# Patient Record
Sex: Male | Born: 1960 | Race: Black or African American | Hispanic: No | Marital: Single | Smoking: Never smoker
Health system: Southern US, Community
[De-identification: ages and names within clinical notes are randomized; demographics above are authoritative.]

## PROBLEM LIST (undated history)

## (undated) ENCOUNTER — Emergency Department (HOSPITAL_COMMUNITY): Payer: MEDICAID

## (undated) DIAGNOSIS — R569 Unspecified convulsions: Secondary | ICD-10-CM

---

## 2006-08-29 ENCOUNTER — Emergency Department (HOSPITAL_COMMUNITY): Admission: EM | Admit: 2006-08-29 | Discharge: 2006-08-29 | Payer: Self-pay | Admitting: Emergency Medicine

## 2015-01-03 ENCOUNTER — Ambulatory Visit (INDEPENDENT_AMBULATORY_CARE_PROVIDER_SITE_OTHER): Payer: Commercial Managed Care - PPO | Admitting: Emergency Medicine

## 2015-01-03 VITALS — BP 142/84 | HR 69 | Temp 97.7°F | Resp 16 | Ht 66.5 in | Wt 182.0 lb

## 2015-01-03 DIAGNOSIS — M545 Low back pain, unspecified: Secondary | ICD-10-CM

## 2015-01-03 DIAGNOSIS — S335XXA Sprain of ligaments of lumbar spine, initial encounter: Secondary | ICD-10-CM | POA: Diagnosis not present

## 2015-01-03 DIAGNOSIS — M5489 Other dorsalgia: Secondary | ICD-10-CM | POA: Diagnosis not present

## 2015-01-03 MED ORDER — CYCLOBENZAPRINE HCL 10 MG PO TABS: 10.0000 mg | ORAL_TABLET | Freq: Three times a day (TID) | ORAL | Status: DC | PRN

## 2015-01-03 MED ORDER — ACETAMINOPHEN-CODEINE #3 300-30 MG PO TABS: 1.0000 | ORAL_TABLET | ORAL | Status: DC | PRN

## 2015-01-03 MED ORDER — NAPROXEN SODIUM 550 MG PO TABS: 550.0000 mg | ORAL_TABLET | Freq: Two times a day (BID) | ORAL | Status: DC

## 2015-01-03 NOTE — Progress Notes (Signed)
[facility] [address], [place] [id number] [phone]  Date:  01/03/2015   Name:  Joe Bell   DOB:  01/11/1961   MRN:  [id number]  PCP:  No primary care provider on file.    Chief Complaint: Back Pain   History of Present Illness:  Joe Bell is a 54 y.o. very pleasant male patient who presents with the following:  Pain in low back for a week.  Says began after bending over lifting trash bags Non radiating No neuro symptoms or weakness. No history of direct injury No history of prior low back pain. Works as a Artistjanitory advil diminishes pain but not gone completely Worse when bends No improvement with over the counter medications or other home remedies.  Denies other complaint or health concern today.   There are no active problems to display for this patient.   History reviewed. No pertinent past medical history.  History reviewed. No pertinent past surgical history.  History  Substance Use Topics  . Smoking status: Never Smoker   . Smokeless tobacco: Not on file  . Alcohol Use: Not on file    History reviewed. No pertinent family history.  No Known Allergies  Medication list has been reviewed and updated.  No current outpatient prescriptions on file prior to visit.   No current facility-administered medications on file prior to visit.    Review of Systems:  As per HPI, otherwise negative.    Physical Examination: Filed Vitals:   01/03/15 1629  BP: 142/84  Pulse: 69  Temp: 97.7 F (36.5 C)  Resp: 16   Filed Vitals:   01/03/15 1629  Height: 5' 6.5" (1.689 m)  Weight: 182 lb (82.555 kg)   Body mass index is 28.94 kg/(m^2). Ideal Body Weight: Weight in (lb) to have BMI = 25: 156.9  GEN: WDWN, NAD, Non-toxic, A & O x 3 HEENT: Atraumatic, Normocephalic. Neck supple. No masses, No LAD. Ears and Nose: No external deformity. CV: RRR, No M/G/R. No JVD. No thrill. No extra heart sounds. PULM: CTA B, no wheezes, crackles,  rhonchi. No retractions. No resp. distress. No accessory muscle use. ABD: S, NT, ND, +BS. No rebound. No HSM. EXTR: No c/c/e NEURO Normal gait.   Gross motor intact lowers. PSYCH: Normally interactive. Conversant. Not depressed or anxious appearing.  Calm demeanor.  BACK  Tender lumbar paraspinous muscles.  Some spasm   Assessment and Plan: Lumbar strain Anaprox Flexeril Heat Tylenol #3  Signed,  Phillips OdorJeffery Nyana Haren, MD

## 2015-01-03 NOTE — Patient Instructions (Signed)
Lumbo Lumbosacral Strain Lumbosacral strain is a strain of any of the parts that make up your lumbosacral vertebrae. Your lumbosacral vertebrae are the bones that make up the lower third of your backbone. Your lumbosacral vertebrae are held together by muscles and tough, fibrous tissue (ligaments).  CAUSES  A sudden blow to your back can cause lumbosacral strain. Also, anything that causes an excessive stretch of the muscles in the low back can cause this strain. This is typically seen when people exert themselves strenuously, fall, lift heavy objects, bend, or crouch repeatedly. RISK FACTORS  Physically demanding work.  Participation in pushing or pulling sports or sports that require a sudden twist of the back (tennis, golf, baseball).  Weight lifting.  Excessive lower back curvature.  Forward-tilted pelvis.  Weak back or abdominal muscles or both.  Tight hamstrings. SIGNS AND SYMPTOMS  Lumbosacral strain may cause pain in the area of your injury or pain that moves (radiates) down your leg.  DIAGNOSIS Your health care provider can often diagnose lumbosacral strain through a physical exam. In some cases, you may need tests such as X-ray exams.  TREATMENT  Treatment for your lower back injury depends on many factors that your clinician will have to evaluate. However, most treatment will include the use of anti-inflammatory medicines. HOME CARE INSTRUCTIONS   Avoid hard physical activities (tennis, racquetball, waterskiing) if you are not in proper physical condition for it. This may aggravate or create problems.  If you have a back problem, avoid sports requiring sudden body movements. Swimming and walking are generally safer activities.  Maintain good posture.  Maintain a healthy weight.  For acute conditions, you may put ice on the injured area.  Put ice in a plastic bag.  Place a towel between your skin and the bag.  Leave the ice on for 20 minutes, 2-3 times a  day.  When the low back starts healing, stretching and strengthening exercises may be recommended. SEEK MEDICAL CARE IF:  Your back pain is getting worse.  You experience severe back pain not relieved with medicines. SEEK IMMEDIATE MEDICAL CARE IF:   You have numbness, tingling, weakness, or problems with the use of your arms or legs.  There is a change in bowel or bladder control.  You have increasing pain in any area of the body, including your belly (abdomen).  You notice shortness of breath, dizziness, or feel faint.  You feel sick to your stomach (nauseous), are throwing up (vomiting), or become sweaty.  You notice discoloration of your toes or legs, or your feet get very cold. MAKE SURE YOU:   Understand these instructions.  Will watch your condition.  Will get help right away if you are not doing well or get worse. Document Released: 07/01/2005 Document Revised: 09/26/2013 Document Reviewed: 05/10/2013 [facility] Patient Information 2015 [employer] [place]. This information is not intended to replace advice given to you by your health care provider. Make sure you discuss any questions you have with your health care provider.

## 2015-08-14 ENCOUNTER — Emergency Department (HOSPITAL_COMMUNITY): Payer: Commercial Managed Care - PPO

## 2015-08-14 ENCOUNTER — Encounter (HOSPITAL_COMMUNITY): Payer: Self-pay | Admitting: Emergency Medicine

## 2015-08-14 ENCOUNTER — Emergency Department (HOSPITAL_COMMUNITY)
Admission: EM | Admit: 2015-08-14 | Discharge: 2015-08-14 | Disposition: A | Discharge status: Home / Self Care | Payer: Commercial Managed Care - PPO | Attending: Emergency Medicine | Admitting: Emergency Medicine

## 2015-08-14 ENCOUNTER — Ambulatory Visit (INDEPENDENT_AMBULATORY_CARE_PROVIDER_SITE_OTHER): Payer: Commercial Managed Care - PPO | Admitting: Family Medicine

## 2015-08-14 VITALS — BP 102/60 | HR 80 | Temp 97.8°F | Resp 16 | Ht 67.25 in | Wt 186.2 lb

## 2015-08-14 DIAGNOSIS — R1032 Left lower quadrant pain: Secondary | ICD-10-CM | POA: Diagnosis not present

## 2015-08-14 DIAGNOSIS — R1031 Right lower quadrant pain: Secondary | ICD-10-CM | POA: Diagnosis not present

## 2015-08-14 DIAGNOSIS — M542 Cervicalgia: Secondary | ICD-10-CM | POA: Diagnosis not present

## 2015-08-14 DIAGNOSIS — J159 Unspecified bacterial pneumonia: Secondary | ICD-10-CM | POA: Diagnosis not present

## 2015-08-14 DIAGNOSIS — I712 Thoracic aortic aneurysm, without rupture, unspecified: Secondary | ICD-10-CM

## 2015-08-14 DIAGNOSIS — M79602 Pain in left arm: Secondary | ICD-10-CM | POA: Diagnosis not present

## 2015-08-14 DIAGNOSIS — R079 Chest pain, unspecified: Secondary | ICD-10-CM | POA: Diagnosis present

## 2015-08-14 DIAGNOSIS — R0789 Other chest pain: Secondary | ICD-10-CM | POA: Diagnosis not present

## 2015-08-14 DIAGNOSIS — J189 Pneumonia, unspecified organism: Secondary | ICD-10-CM

## 2015-08-14 LAB — COMPREHENSIVE METABOLIC PANEL
ALK PHOS: 68 U/L (ref 38–126)
ALT: 22 U/L (ref 17–63)
ANION GAP: 7 (ref 5–15)
AST: 21 U/L (ref 15–41)
Albumin: 3 g/dL — ABNORMAL LOW (ref 3.5–5.0)
BILIRUBIN TOTAL: 0.6 mg/dL (ref 0.3–1.2)
BUN: 15 mg/dL (ref 6–20)
CALCIUM: 8.2 mg/dL — AB (ref 8.9–10.3)
CO2: 25 mmol/L (ref 22–32)
CREATININE: 1.08 mg/dL (ref 0.61–1.24)
Chloride: 107 mmol/L (ref 101–111)
Glucose, Bld: 106 mg/dL — ABNORMAL HIGH (ref 65–99)
Potassium: 4.2 mmol/L (ref 3.5–5.1)
Sodium: 139 mmol/L (ref 135–145)
TOTAL PROTEIN: 6.7 g/dL (ref 6.5–8.1)

## 2015-08-14 LAB — CBC
HEMATOCRIT: 43.9 % (ref 39.0–52.0)
Hemoglobin: 14.1 g/dL (ref 13.0–17.0)
MCH: 27.2 pg (ref 26.0–34.0)
MCHC: 32.1 g/dL (ref 30.0–36.0)
MCV: 84.7 fL (ref 78.0–100.0)
PLATELETS: 276 10*3/uL (ref 150–400)
RBC: 5.18 MIL/uL (ref 4.22–5.81)
RDW: 12 % (ref 11.5–15.5)
WBC: 7.5 10*3/uL (ref 4.0–10.5)

## 2015-08-14 LAB — I-STAT TROPONIN, ED: Troponin i, poc: 0 ng/mL (ref 0.00–0.08)

## 2015-08-14 LAB — LIPASE, BLOOD: Lipase: 23 U/L (ref 11–51)

## 2015-08-14 MED ORDER — IOHEXOL 350 MG/ML SOLN
100.0000 mL | Freq: Once | INTRAVENOUS | Status: AC | PRN
  Administered 2015-08-14: 100 mL via INTRAVENOUS

## 2015-08-14 MED ORDER — ONDANSETRON HCL 4 MG/2ML IJ SOLN
4.0000 mg | Freq: Once | INTRAMUSCULAR | Status: AC
  Administered 2015-08-14: 4 mg via INTRAVENOUS
  Filled 2015-08-14: qty 2

## 2015-08-14 MED ORDER — LEVOFLOXACIN IN D5W 500 MG/100ML IV SOLN
500.0000 mg | Freq: Once | INTRAVENOUS | Status: AC
  Administered 2015-08-14: 500 mg via INTRAVENOUS
  Filled 2015-08-14: qty 100

## 2015-08-14 MED ORDER — MORPHINE SULFATE (PF) 4 MG/ML IV SOLN
4.0000 mg | Freq: Once | INTRAVENOUS | Status: AC
Start: 2015-08-14 — End: 2015-08-14
  Administered 2015-08-14: 4 mg via INTRAVENOUS
  Filled 2015-08-14: qty 1

## 2015-08-14 MED ORDER — LEVOFLOXACIN 500 MG PO TABS: 500.0000 mg | ORAL_TABLET | Freq: Every day | ORAL | Status: DC

## 2015-08-14 NOTE — ED Provider Notes (Signed)
CSN: [id number]     Arrival date & time 08/14/15  1136 History   First MD Initiated Contact with Patient 08/14/15 1155     Chief Complaint  Patient presents with  . Chest Pain  . Neck Pain     (Consider location/radiation/quality/duration/timing/severity/associated sxs/prior Treatment) HPI  Pt presenting with c/o lower abdominal pain as well as left sided neck pain and intermittent chest pain.  He states symptoms began yesterday.  He states the pain in his left neck and chest are worse with movement and palpation of the areas.  No difficulty breathing, no nausea, diaphoresis or radiation of the pain.  No swelling of arms or legs.  He also has developed last night some lower abdominal pain.  He describes the pain as cramping and similar on both sides of lower abdomen.  No fever, no vomiting, no change in stools.  No hx of DVT/PE, no recent travel.  He has not had any treatment piror to arrival, he was seen by his PMD and advised to come to the ED for further evaluation of his symptoms.  There are no other associated systemic symptoms, there are no other alleviating or modifying factors.   History reviewed. No pertinent past medical history. History reviewed. No pertinent past surgical history. No family history on file. Social History  Substance Use Topics  . Smoking status: Never Smoker   . Smokeless tobacco: None  . Alcohol Use: None    Review of Systems  ROS reviewed and all otherwise negative except for mentioned in HPI    Allergies  Review of patient's allergies indicates no known allergies.  Home Medications   Prior to Admission medications   Medication Sig Start Date End Date Taking? Authorizing Provider  ibuprofen (ADVIL,MOTRIN) 200 MG tablet Take 800 mg by mouth every 6 (six) hours as needed for mild pain.   Yes Historical Provider, MD  levofloxacin (LEVAQUIN) 500 MG tablet Take 1 tablet (500 mg total) by mouth daily. 08/14/15   Jerelyn ScottMartha Linker, MD   BP 114/90 mmHg  Pulse  78  Temp(Src) 98.7 F (37.1 C) (Oral)  Resp 18  SpO2 99%  Vitals reviewed Physical Exam  Physical Examination: General appearance - alert, well appearing, and in no distress Mental status - alert, oriented to person, place, and time Eyes - no conjunctival injection no scleral icterus Mouth - mucous membranes moist, pharynx normal without lesions  Neck- left sided tenderness to palpation over SCM and trapezius distributions, no sig LAD Chest - clear to auscultation, no wheezes, rales or rhonchi, symmetric air entry, ttp over anterior left chest pain no crepitus Heart - normal rate, regular rhythm, normal S1, S2, no murmurs, rubs, clicks or gallops Abdomen - soft, nontender, nondistended, no masses or organomegaly Neurological - alert, oriented, normal speech, moving all extremities, sensation intact Extremities - peripheral pulses normal, no pedal edema, no clubbing or cyanosis Skin - normal coloration and turgor, no rashes  ED Course  Procedures (including critical care time) Labs Review Labs Reviewed  COMPREHENSIVE METABOLIC PANEL - Abnormal; Notable for the following:    Glucose, Bld 106 (*)    Calcium 8.2 (*)    Albumin 3.0 (*)    All other components within normal limits  CBC  LIPASE, BLOOD  I-STAT TROPOININ, ED    Imaging Review No results found. I have personally reviewed and evaluated these images and lab results as part of my medical decision-making.   EKG Interpretation   Date/Time:  Wednesday August 14 2015  11:40:30 EST Ventricular Rate:  71 PR Interval:  145 QRS Duration: 90 QT Interval:  381 QTC Calculation: 414 R Axis:   -23 Text Interpretation:  Sinus rhythm Borderline left axis deviation Abnormal  R-wave progression, early transition Borderline T wave abnormalities No  old tracing to compare Confirmed by [facility]  MD, MARTHA [phone]) on  08/14/2015 1:16:39 PM      MDM   Final diagnoses:  Community acquired pneumonia    Pt presenting with left  sided neck pain and chest wall pain with associated lower abdominal pain.  abd CT obtained and shows no acute findings.  CXR/CT show left sided pleural effusion with pneumonia.  Pt started on levaquin in the ED.  I suspect his left neck findings are muscular in origin as he is tender over SCM and trapezius distribution.  CT does show some possible LAD on left neck- advised f/u CT neck as an outpatient.  All results were d/w patient and family at bedside.  Advised to f/u with [facility] on an outpatient basis.  Discharged with strict return precautions.  Pt agreeable with plan.    Jerelyn Scott, MD 08/16/15 [id number]

## 2015-08-14 NOTE — ED Notes (Signed)
[place]ausa interpretor from [facility] utilized to discharge patient.

## 2015-08-14 NOTE — ED Notes (Signed)
Lab contacted regarding CBC result. Zelda in lab to call this RN back when she has more information regarding results.

## 2015-08-14 NOTE — Progress Notes (Addendum)
Subjective:  This chart was scribed for Joe Staggers, MD by Stann Ore, Medical Scribe. This patient was seen in room 14 and the patient's care was started 10:33 AM.   Patient ID: Joe Bell, male    DOB: [birth year], 54 y.o.   MRN: [id number]  HPI Joe Bell is a 54 y.o. male Pt complains of abd and neck pain that was noticed yesterday. He noticed the neck pain in the morning. He noticed the abd and chest pains later that evening.   Neck Pain He noticed left neck pain starting yesterday without any injury. It's radiating down to his left arm. He's taken 4 ibuprofen yesterday. The pain is into anterior neck without posterior neck pain.   Abd Pain He feels heavy with pain in lower quadrants when he moves.   Chest Pain He also has some persistent pressure chest pains that radiates to his back since yesterday as well. The chest pressure has gotten worse than yesterday, as he was able to go to work yesterday, but not today. He rates yesterday's pain being 3-4, and today's pain around 7-8 (on a scale of 1-10, 1 being lowest, 10 being worst). He denies past surgeries in his chest. He denies sweating, nausea, vomiting. He denies family history of heart problems. He denies constipation, diarrhea, black tarry stools, blood stool. He denies any recent air travel, prolonged car travel. He denies history of DVT, or calf swelling.   Language There is a language barrier; his relative translated for him in the room.  Pt's primary language is Hausa. He is from [place].   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   There are no active problems to display for this patient.  History reviewed. No pertinent past medical history. History reviewed. No pertinent past surgical history. No Known Allergies Prior to Admission medications   Medication Sig Start Date End Date Taking? Authorizing Provider  acetaminophen-codeine (TYLENOL #3) 300-30 MG per tablet Take 1-2 tablets by mouth every 4 (four)  hours as needed. Patient not taking: Reported on 08/14/2015 01/03/15   Carmelina Dane, MD  cyclobenzaprine (FLEXERIL) 10 MG tablet Take 1 tablet (10 mg total) by mouth 3 (three) times daily as needed for muscle spasms. Patient not taking: Reported on 08/14/2015 01/03/15   Carmelina Dane, MD  naproxen sodium (ANAPROX DS) 550 MG tablet Take 1 tablet (550 mg total) by mouth 2 (two) times daily with a meal. Patient not taking: Reported on 08/14/2015 01/03/15 01/03/16  Carmelina Dane, MD   Social History   Social History  . Marital Status: Single    Spouse Name: N/A  . Number of Children: N/A  . Years of Education: N/A   Occupational History  . Not on file.   Social History Main Topics  . Smoking status: Never Smoker   . Smokeless tobacco: Not on file  . Alcohol Use: Not on file  . Drug Use: Not on file  . Sexual Activity: Not on file   Other Topics Concern  . Not on file   Social History Narrative    Review of Systems  Constitutional: Negative for fever and diaphoresis.  Cardiovascular: Positive for chest pain. Negative for leg swelling.  Gastrointestinal: Positive for abdominal pain. Negative for nausea, vomiting, diarrhea, constipation and blood in stool.  Musculoskeletal: Positive for back pain and neck pain.       Objective:   Physical Exam  Constitutional: He is oriented to person, place, and time. He appears well-developed and  well-nourished.  HENT:  Head: Normocephalic and atraumatic.  Eyes: EOM are normal. Pupils are equal, round, and reactive to light.  Neck: No JVD present. Carotid bruit is not present.  No cervical tenderness, Soft tissue swelling with diffuse tenderness in the left clavicle   Cardiovascular: Normal rate, regular rhythm and normal heart sounds.   No murmur heard. Pulmonary/Chest: Effort normal and breath sounds normal. He has no rales.  Abdominal: There is tenderness.  Diffusely tender over L and R lower quadrants  Musculoskeletal: He  exhibits no edema.  Neurological: He is alert and oriented to person, place, and time.  Skin: Skin is warm and dry.  Psychiatric: He has a normal mood and affect.  Vitals reviewed.   Filed Vitals:   08/14/15 1014  BP: 102/60  Pulse: 80  Temp: 97.8 F (36.6 C)  TempSrc: Oral  Resp: 16  Height: 5' 7.25" (1.708 m)  Weight: 186 lb 3.2 oz (84.46 kg)  SpO2: 98%   EKG: sinus rate 77, diffuse flattening of t-waves without previous ekg for comparison.  11:07 AM EMS called for transport. IV placed, 20 gauge , right AC, NS     Assessment & Plan:  Joe Acede Lewey is a 54 y.o. male Chest pressure - Plan: EKG 12-Lead  Left arm pain - Plan: EKG 12-Lead  Right lower quadrant abdominal pain  Abdominal pain, LLQ   initial symptoms of left-sided neck to left arm pain followed by left-sided chest pain and pressure with radiation of pain to the back. Describes component of pleuritic pain with deep breath. Followed by lower abdominal pain since yesterday. Pain has increased in intensity his chest since yesterday, but no associated nausea vomiting diaphoresis or dyspnea. No known history of cardiac disease no known history of DVT/PE or risk factors.  - with his pain increase today, with pleuritic and pressure component will have evaluated further through the emergency room. No previous EKG for comparison, nonspecific T-wave diffusely. EMS called for transport as above, transfer of care with report at 1116. Press photographerCharge [facility]..   Further evaluation of abdominal pain through the emergency room, differential diagnosis  Includes diverticulitis with lower abdominal pain, but also use of ibuprofen yesterday, may have some GI upset. Did not give aspirin in office given the abdominal pain and  Ibuprofen use yesterday.   No orders of the defined types were placed in this encounter.   Patient Instructions  Further evalaution will be needed at the hospital for your chest pain/pressure, left arm  pain, and abdominal pain. If follow up needed after the emergency room - return for recheck here as needed. The area of swelling over your left shoulder may be a lipoma (benign fatty tumor), but this can also be evaluated further in the emergency room.       I personally performed the services described in this documentation, which was scribed in my presence. The recorded information has been reviewed and considered, and addended by me as needed.    By signing my name below, I, Stann Oresung-Kai Tsai, attest that this documentation has been prepared under the direction and in the presence of Joe StaggersJeffrey Shakema Surita, MD. Electronically Signed: Stann Oresung-Kai Tsai, Scribe. 08/14/2015 , 10:33 AM .

## 2015-08-14 NOTE — ED Notes (Signed)
Pt from [facility] via ems for c/o left neck pain that radiates to his chest since last night. Pt alert and oriented, nad.

## 2015-08-14 NOTE — ED Notes (Signed)
Patient transported to CT 

## 2015-08-14 NOTE — ED Notes (Signed)
Pt speaks Hausa

## 2015-08-14 NOTE — ED Notes (Signed)
Patient transported to X-ray 

## 2015-08-14 NOTE — Patient Instructions (Addendum)
Further evalaution will be needed at the hospital for your chest pain/pressure, left arm pain, and abdominal pain. If follow up needed after the emergency room - return for recheck here as needed. The area of swelling over your left shoulder may be a lipoma (benign fatty tumor), but this can also be evaluated further in the emergency room.

## 2015-08-14 NOTE — Discharge Instructions (Signed)
Return to the ED with any concerns including difficulty breathing, chest pain, vomiting and not able to keep down liquids or antibiotics, decreased level of alertness/lethargy, or any other alarming symptoms  The CT scan result shows the following---it is recommended that you followp with your primary care doctor to have a CT scan of your neck performed:  "Mild soft tissue edema within the anterior chest wall and extending into the left neck without focal fluid collection. Some questionable lymph nodes are noted in the left neck although evaluation is incomplete. Followup CT of the neck is recommended although given the contrast load today this would be best performed in the coming Days."

## 2015-08-19 ENCOUNTER — Ambulatory Visit (INDEPENDENT_AMBULATORY_CARE_PROVIDER_SITE_OTHER): Payer: Commercial Managed Care - PPO | Admitting: Family Medicine

## 2015-08-19 VITALS — BP 120/80 | HR 66 | Temp 97.2°F | Resp 16 | Ht 67.0 in | Wt 183.4 lb

## 2015-08-19 DIAGNOSIS — J189 Pneumonia, unspecified organism: Secondary | ICD-10-CM

## 2015-08-19 NOTE — Progress Notes (Signed)
This chart was scribed for Joe Sidle, MD by Stann Ore, medical scribe at [facility].The patient was seen in exam room 12 and the patient's care was started at 9:45 AM.  Patient ID: Joe Bell MRN: [id number], DOB: [birth year], 54 y.o. Date of Encounter: 08/19/2015  Primary Physician: No primary care provider on file.  Chief Complaint:  Chief Complaint  Patient presents with  . Follow-up    chest pain and shoulder pain, x 6 days     HPI:  Joe Bell is a 54 y.o. male who presents to [facility] for follow up on chest pain and shoulder pain. He was seen by Dr. Neva Seat in the office 5 days ago. The pain has improved, and he is feeling a lot better. The swelling in the left supraclavicular area has resolved.  ED visit showed some questionable findings CT that suggested community-acquired pneumonia but these were not definitive  Neck Pain He noticed left neck pain starting yesterday without any injury. It's radiating down to his left arm. He's taken 4 ibuprofen yesterday. The pain is into anterior neck without posterior neck pain.   Abd Pain He feels heavy with pain in lower quadrants when he moves.   Chest Pain He also has some persistent pressure chest pains that radiates to his back since yesterday as well. The chest pressure has gotten worse than yesterday, as he was able to go to work yesterday, but not today. He rates yesterday's pain being 3-4, and today's pain around 7-8 (on a scale of 1-10, 1 being lowest, 10 being worst). He denies past surgeries in his chest. He denies sweating, nausea, vomiting. He denies family history of heart problems. He denies constipation, diarrhea, black tarry stools, blood stool. He denies any recent air travel, prolonged car travel. He denies history of DVT, or calf swelling.   Language Pt's primary language is Hausa. He is from [place].   He works in Production designer, theatre/television/film in [employer].  He's from [place].     History reviewed. No pertinent past medical history.   Home Meds: Prior to Admission medications   Medication Sig Start Date End Date Taking? Authorizing Provider  ibuprofen (ADVIL,MOTRIN) 200 MG tablet Take 800 mg by mouth every 6 (six) hours as needed for mild pain.   Yes Historical Provider, MD  levofloxacin (LEVAQUIN) 500 MG tablet Take 1 tablet (500 mg total) by mouth daily. 08/14/15  Yes Jerelyn Scott, MD    Allergies: No Known Allergies  Social History   Social History  . Marital Status: Single    Spouse Name: N/A  . Number of Children: N/A  . Years of Education: N/A   Occupational History  . Not on file.   Social History Main Topics  . Smoking status: Never Smoker   . Smokeless tobacco: Never Used  . Alcohol Use: No  . Drug Use: No  . Sexual Activity: Not on file   Other Topics Concern  . Not on file   Social History Narrative     Review of Systems: Constitutional: negative for fever, chills, night sweats, weight changes, or fatigue  HEENT: negative for vision changes, hearing loss, congestion, rhinorrhea, ST, epistaxis, or sinus pressure Cardiovascular: negative for chest pain or palpitations Respiratory: negative for hemoptysis, wheezing, shortness of breath, or cough Abdominal: negative for abdominal pain, nausea, vomiting, diarrhea, or constipation Dermatological: negative for rash Neurologic: negative for headache, dizziness, or syncope All other systems reviewed and are otherwise negative with the  exception to those above and in the HPI.  Physical Exam: Blood pressure 120/80, pulse 66, temperature 97.2 F (36.2 C), temperature source Oral, resp. rate 16, height 5\' 7"  (1.702 m), weight 183 lb 6.4 oz (83.19 kg), SpO2 97 %., Body mass index is 28.72 kg/(m^2). General: Well developed, well nourished, in no acute distress. Head: Normocephalic, atraumatic, eyes without discharge, sclera non-icteric, nares are without discharge. Bilateral auditory canals  clear, TM's are without perforation, pearly grey and translucent with reflective cone of light bilaterally. Oral cavity moist, posterior pharynx without exudate, erythema, peritonsillar abscess, or post nasal drip.  Neck: Supple. No thyromegaly. Full ROM. No lymphadenopathy. Lungs: Clear bilaterally to auscultation without wheezes, rales, or rhonchi. Breathing is unlabored. Heart: RRR with S1 S2. No murmurs, rubs, or gallops appreciated. Abdomen: Soft, non-tender, non-distended with normoactive bowel sounds. No hepatomegaly. No rebound/guarding. No obvious abdominal masses. Msk:  Strength and tone normal for age. Extremities/Skin: Warm and dry. No clubbing or cyanosis. No edema. No rashes or suspicious lesions. Neuro: Alert and oriented X 3. Moves all extremities spontaneously. Gait is normal. CNII-XII grossly in tact. Psych:  Responds to questions appropriately with a normal affect.    ASSESSMENT AND PLAN:  54 y.o. year old male with  This chart was scribed in my presence and reviewed by me personally.    ICD-9-CM ICD-10-CM   1. CAP (community acquired pneumonia) 486 J18.9    Exam is normal today so I think the patient just needs to finish his medicines and return as needed.   By signing my name below, I, Stann Oresung-Kai Tsai, attest that this documentation has been prepared under the direction and in the presence of Joe SidleKurt Maybell Misenheimer, MD. Electronically Signed: Stann Oresung-Kai Tsai, Scribe. 08/19/2015 , 9:45 AM .  Signed, Joe SidleKurt Regan Mcbryar, MD 08/19/2015 9:45 AM

## 2015-08-19 NOTE — Patient Instructions (Signed)
Finish her medicines as directed. Expect complete resolution in the next few days

## 2019-11-22 ENCOUNTER — Observation Stay (HOSPITAL_COMMUNITY): Payer: Self-pay

## 2019-11-22 ENCOUNTER — Other Ambulatory Visit: Payer: Self-pay

## 2019-11-22 ENCOUNTER — Inpatient Hospital Stay (HOSPITAL_COMMUNITY)
Admission: EM | Admit: 2019-11-22 | Discharge: 2019-11-26 | DRG: 982 | Disposition: A | Discharge status: Home / Self Care | Payer: Commercial Managed Care - PPO | Attending: Internal Medicine | Admitting: Internal Medicine

## 2019-11-22 ENCOUNTER — Encounter (HOSPITAL_COMMUNITY): Payer: Self-pay

## 2019-11-22 ENCOUNTER — Emergency Department (HOSPITAL_COMMUNITY): Payer: Self-pay

## 2019-11-22 DIAGNOSIS — S43006A Unspecified dislocation of unspecified shoulder joint, initial encounter: Secondary | ICD-10-CM

## 2019-11-22 DIAGNOSIS — S43024A Posterior dislocation of right humerus, initial encounter: Secondary | ICD-10-CM | POA: Diagnosis present

## 2019-11-22 DIAGNOSIS — Z09 Encounter for follow-up examination after completed treatment for conditions other than malignant neoplasm: Secondary | ICD-10-CM

## 2019-11-22 DIAGNOSIS — D72829 Elevated white blood cell count, unspecified: Secondary | ICD-10-CM

## 2019-11-22 DIAGNOSIS — M659 Synovitis and tenosynovitis, unspecified: Secondary | ICD-10-CM | POA: Diagnosis present

## 2019-11-22 DIAGNOSIS — R569 Unspecified convulsions: Secondary | ICD-10-CM

## 2019-11-22 DIAGNOSIS — D72828 Other elevated white blood cell count: Secondary | ICD-10-CM | POA: Diagnosis present

## 2019-11-22 DIAGNOSIS — S42291A Other displaced fracture of upper end of right humerus, initial encounter for closed fracture: Secondary | ICD-10-CM | POA: Diagnosis present

## 2019-11-22 DIAGNOSIS — S42144A Nondisplaced fracture of glenoid cavity of scapula, right shoulder, initial encounter for closed fracture: Secondary | ICD-10-CM | POA: Diagnosis present

## 2019-11-22 DIAGNOSIS — E872 Acidosis: Secondary | ICD-10-CM | POA: Diagnosis present

## 2019-11-22 DIAGNOSIS — Z20822 Contact with and (suspected) exposure to covid-19: Secondary | ICD-10-CM | POA: Diagnosis present

## 2019-11-22 DIAGNOSIS — S4291XA Fracture of right shoulder girdle, part unspecified, initial encounter for closed fracture: Secondary | ICD-10-CM | POA: Diagnosis present

## 2019-11-22 DIAGNOSIS — M25711 Osteophyte, right shoulder: Secondary | ICD-10-CM | POA: Diagnosis present

## 2019-11-22 DIAGNOSIS — G4089 Other seizures: Principal | ICD-10-CM | POA: Diagnosis present

## 2019-11-22 DIAGNOSIS — R739 Hyperglycemia, unspecified: Secondary | ICD-10-CM | POA: Diagnosis present

## 2019-11-22 DIAGNOSIS — X58XXXA Exposure to other specified factors, initial encounter: Secondary | ICD-10-CM | POA: Diagnosis present

## 2019-11-22 LAB — SARS CORONAVIRUS 2 (TAT 6-24 HRS): SARS Coronavirus 2: NEGATIVE

## 2019-11-22 LAB — URINALYSIS, ROUTINE W REFLEX MICROSCOPIC
Bilirubin Urine: NEGATIVE
Glucose, UA: 50 mg/dL — AB
Ketones, ur: NEGATIVE mg/dL
Leukocytes,Ua: NEGATIVE
Nitrite: NEGATIVE
Protein, ur: NEGATIVE mg/dL
Specific Gravity, Urine: 1.019 (ref 1.005–1.030)
pH: 5 (ref 5.0–8.0)

## 2019-11-22 LAB — CBC WITH DIFFERENTIAL/PLATELET
Abs Immature Granulocytes: 0.06 10*3/uL (ref 0.00–0.07)
Basophils Absolute: 0 10*3/uL (ref 0.0–0.1)
Basophils Relative: 0 %
Eosinophils Absolute: 0.1 10*3/uL (ref 0.0–0.5)
Eosinophils Relative: 1 %
HCT: 41 % (ref 39.0–52.0)
Hemoglobin: 12.8 g/dL — ABNORMAL LOW (ref 13.0–17.0)
Immature Granulocytes: 0 %
Lymphocytes Relative: 36 %
Lymphs Abs: 5.6 10*3/uL — ABNORMAL HIGH (ref 0.7–4.0)
MCH: 27.2 pg (ref 26.0–34.0)
MCHC: 31.2 g/dL (ref 30.0–36.0)
MCV: 87.2 fL (ref 80.0–100.0)
Monocytes Absolute: 0.9 10*3/uL (ref 0.1–1.0)
Monocytes Relative: 6 %
Neutro Abs: 8.8 10*3/uL — ABNORMAL HIGH (ref 1.7–7.7)
Neutrophils Relative %: 57 %
Platelets: 281 10*3/uL (ref 150–400)
RBC: 4.7 MIL/uL (ref 4.22–5.81)
RDW: 12 % (ref 11.5–15.5)
WBC: 15.5 10*3/uL — ABNORMAL HIGH (ref 4.0–10.5)
nRBC: 0 % (ref 0.0–0.2)

## 2019-11-22 LAB — RAPID URINE DRUG SCREEN, HOSP PERFORMED
Amphetamines: NOT DETECTED
Barbiturates: NOT DETECTED
Benzodiazepines: POSITIVE — AB
Cocaine: NOT DETECTED
Opiates: NOT DETECTED
Tetrahydrocannabinol: NOT DETECTED

## 2019-11-22 LAB — COMPREHENSIVE METABOLIC PANEL
ALT: 19 U/L (ref 0–44)
AST: 44 U/L — ABNORMAL HIGH (ref 15–41)
Albumin: 3.8 g/dL (ref 3.5–5.0)
Alkaline Phosphatase: 54 U/L (ref 38–126)
Anion gap: 19 — ABNORMAL HIGH (ref 5–15)
BUN: 13 mg/dL (ref 8–23)
CO2: 17 mmol/L — ABNORMAL LOW (ref 22–32)
Calcium: 8.4 mg/dL — ABNORMAL LOW (ref 8.9–10.3)
Chloride: 103 mmol/L (ref 98–111)
Creatinine, Ser: 1.18 mg/dL (ref 0.61–1.24)
GFR calc Af Amer: >60 mL/min (ref >60)
GFR calc non Af Amer: >60 mL/min (ref >60)
Glucose, Bld: 190 mg/dL — ABNORMAL HIGH (ref 70–99)
Potassium: 4.4 mmol/L (ref 3.5–5.1)
Sodium: 139 mmol/L (ref 135–145)
Total Bilirubin: 0.9 mg/dL (ref 0.3–1.2)
Total Protein: 7.8 g/dL (ref 6.5–8.1)

## 2019-11-22 LAB — GLUCOSE, CAPILLARY
Glucose-Capillary: 104 mg/dL — ABNORMAL HIGH (ref 70–99)
Glucose-Capillary: 88 mg/dL (ref 70–99)
Glucose-Capillary: 102 mg/dL — ABNORMAL HIGH (ref 70–99)
Glucose-Capillary: 90 mg/dL (ref 70–99)
Glucose-Capillary: 111 mg/dL — ABNORMAL HIGH (ref 70–99)

## 2019-11-22 LAB — ETHANOL: Alcohol, Ethyl (B): <10 mg/dL (ref <10)

## 2019-11-22 LAB — CBG MONITORING, ED: Glucose-Capillary: 135 mg/dL — ABNORMAL HIGH (ref 70–99)

## 2019-11-22 MED ORDER — LORAZEPAM 2 MG/ML IJ SOLN: 1.0000 mg | Freq: Once | INTRAMUSCULAR | Status: AC

## 2019-11-22 MED ORDER — LORAZEPAM 2 MG/ML IJ SOLN
INTRAMUSCULAR | Status: AC
  Administered 2019-11-22: 02:00:00 1 mg via INTRAVENOUS
  Filled 2019-11-22: qty 1

## 2019-11-22 MED ORDER — ACETAMINOPHEN 650 MG RE SUPP: 650.0000 mg | Freq: Four times a day (QID) | RECTAL | Status: DC | PRN

## 2019-11-22 MED ORDER — LORAZEPAM 2 MG/ML IJ SOLN: 2.0000 mg | Freq: Once | INTRAMUSCULAR | Status: DC

## 2019-11-22 MED ORDER — SODIUM CHLORIDE 0.45 % IV SOLN: INTRAVENOUS | Status: AC

## 2019-11-22 MED ORDER — ONDANSETRON HCL 4 MG PO TABS: 4.0000 mg | ORAL_TABLET | Freq: Four times a day (QID) | ORAL | Status: DC | PRN

## 2019-11-22 MED ORDER — INSULIN ASPART 100 UNIT/ML ~~LOC~~ SOLN
0.0000 [IU] | SUBCUTANEOUS | Status: DC
  Administered 2019-11-23: 1 [IU] via SUBCUTANEOUS
  Administered 2019-11-24: 20:00:00 2 [IU] via SUBCUTANEOUS
  Administered 2019-11-25: 22:00:00 1 [IU] via SUBCUTANEOUS
  Administered 2019-11-25 – 2019-11-26 (×3): 2 [IU] via SUBCUTANEOUS

## 2019-11-22 MED ORDER — SODIUM CHLORIDE 0.9% FLUSH
3.0000 mL | Freq: Two times a day (BID) | INTRAVENOUS | Status: DC
  Administered 2019-11-22 – 2019-11-26 (×7): 3 mL via INTRAVENOUS

## 2019-11-22 MED ORDER — ACETAMINOPHEN 325 MG PO TABS
650.0000 mg | ORAL_TABLET | Freq: Four times a day (QID) | ORAL | Status: DC | PRN
  Administered 2019-11-22 – 2019-11-24 (×2): 650 mg via ORAL
  Filled 2019-11-22 (×2): qty 2

## 2019-11-22 MED ORDER — LEVETIRACETAM 500 MG PO TABS
500.0000 mg | ORAL_TABLET | Freq: Two times a day (BID) | ORAL | Status: DC
  Administered 2019-11-22 – 2019-11-26 (×9): 500 mg via ORAL
  Filled 2019-11-22 (×9): qty 1

## 2019-11-22 MED ORDER — ONDANSETRON HCL 4 MG/2ML IJ SOLN: 4.0000 mg | Freq: Four times a day (QID) | INTRAMUSCULAR | Status: DC | PRN

## 2019-11-22 MED ORDER — HALOPERIDOL LACTATE 5 MG/ML IJ SOLN
5.0000 mg | Freq: Once | INTRAMUSCULAR | Status: AC
  Administered 2019-11-22: 04:00:00 5 mg via INTRAVENOUS
  Filled 2019-11-22: qty 1

## 2019-11-22 MED ORDER — DEXTROSE 10 % IV SOLN: INTRAVENOUS | Status: DC

## 2019-11-22 MED ORDER — LEVETIRACETAM IN NACL 1000 MG/100ML IV SOLN
1000.0000 mg | Freq: Once | INTRAVENOUS | Status: AC
  Administered 2019-11-22: 02:00:00 1000 mg via INTRAVENOUS
  Filled 2019-11-22: qty 100

## 2019-11-22 NOTE — Evaluation (Signed)
Clinical/Bedside Swallow Evaluation Patient Details  Name: Joe Bell MRN: [id number] Date of Birth: October 22, 1960  Today's Date: 11/22/2019 Time: SLP Start Time (ACUTE ONLY): 1105 SLP Stop Time (ACUTE ONLY): 1130 SLP Time Calculation (min) (ACUTE ONLY): 25 min  Past Medical History: History reviewed. No pertinent past medical history. Past Surgical History: History reviewed. No pertinent surgical history. HPI:  Joe Bell is a 59 y.o. male with no known past medical history. Unresponsive at home, negative CT, undergoing assessment for possible seizures.    Assessment / Plan / Recommendation Clinical Impression  Pt is at reduced risk of aspiration as he consumed thin liquids via straw and graham cracker with no overt s/s of aspiration or dysphagia. At this time, recommend upgrading pt's diet to regular with thin liquids. Further skilled ST is not indicated for dysphagia.  SLP Visit Diagnosis: Dysphagia, unspecified (R13.10)    Aspiration Risk  No limitations    Diet Recommendation Regular;Thin liquid   Liquid Administration via: Cup;Straw Medication Administration: Whole meds with liquid Supervision: Patient able to self feed Compensations: Slow rate;Small sips/bites Postural Changes: Seated upright at 90 degrees    Other  Recommendations Oral Care Recommendations: Oral care BID   Follow up Recommendations None      Frequency and Duration            Prognosis        Swallow Study   General Date of Onset: 11/21/19 HPI: [name] is a 59 y.o. male with no known past medical history. Unresponsive at home, negative CT, undergoing assessment for possible seizures.  Type of Study: Bedside Swallow Evaluation Previous Swallow Assessment: none in chart Diet Prior to this Study: NPO Temperature Spikes Noted: No Respiratory Status: Room air History of Recent Intubation: No Behavior/Cognition: Alert;Pleasant mood Oral Cavity Assessment: Within Functional Limits Oral Care  Completed by SLP: No Oral Cavity - Dentition: Adequate natural dentition Vision: Functional for self-feeding Self-Feeding Abilities: Able to feed self Patient Positioning: Upright in bed Baseline Vocal Quality: Normal Volitional Cough: Strong Volitional Swallow: Able to elicit    Oral/Motor/Sensory Function Overall Oral Motor/Sensory Function: Within functional limits   Ice Chips Ice chips: Not tested   Thin Liquid Thin Liquid: Within functional limits Presentation: Self Fed;Straw    Nectar Thick Nectar Thick Liquid: Not tested   Honey Thick Honey Thick Liquid: Not tested   Puree Puree: Not tested   Solid     Solid: Within functional limits Presentation: Self Fed      [name] 11/22/2019,1:18 PM

## 2019-11-22 NOTE — Progress Notes (Signed)
This patient was admitted by my colleague, Dr. Dr. Antionette Char early this morning from the ED after he was found to be poorly responsive with agonal respirations at home. When EMS arrived he became more alert and was brought to the ED where he was found to have generalized tocnic-clonic seizure in the ED.   [facility] were consulted to admit the patient for further evaluation and care.  Neurology was consulted. The patient has been loaded with keppra and started on a regular dose. EEG is pending.  Vital signs are within normal limits except blood pressures are a little elevated and the patient has a low grade temp at 99.5. he is saturating 96% on room air.  The patient is resting comfortably. He is awake and alert. No acute distress. No increased work of breathing.. Pulses appear normal. No cyanosis, clubbing or edema. The patient is moving all extremities.  I appreciate neurology's assistance.

## 2019-11-22 NOTE — ED Triage Notes (Signed)
Pt arrived via [facility] from home. Pt does not understand english, pt speaks Hausa. Pt's family said that they found him on the couch having trouble breathing and that he would not wake up. EMS stated pt was unconscious but breathing when they arrived. Once EMS started doing things pt became alert and slowly started trying to communicate with EMS. Pt denied any hx of seizures but also stated he was not sure what that was.

## 2019-11-22 NOTE — H&P (Signed)
History and Physical    Joe Bell DOB: September 24, 1961 DOA: 11/22/2019  PCP: Patient, No Pcp Per   Patient coming from: Home   Chief Complaint: Poorly responsive, abnormal breathing   HPI: Joe Bell is a 59 y.o. male with no known past medical history, now presenting to the emergency department after he was found to be poorly responsive with abnormal breathing at home.  Patient is accompanied by a friend who lives in the same house, reports that the patient seemed to be in his usual state yesterday evening, is not known to use alcohol or illicit substances, and does not not have any known chronic illness or seizure history.  Patient was reportedly noted to have abnormal respirations while lying on a couch, could not be aroused, and EMS was called.  In the presence of EMS, the patient began to become more alert and was brought into the ED.  ED Course: Upon arrival to the ED, patient is found to be afebrile, saturating mid 90s on room air, and with stable blood pressure.  EKG features a sinus rhythm.  Chemistry panel with mild metabolic acidosis and glucose 190.  CBC notable for leukocytosis to 15,500.  Patient had generalized seizure like activity in the emergency department, was given 1 mg IV Ativan, 1 g IV Keppra, and neurology was consulted by the ED physician.  Neurology has seen the patient in the emergency department and recommends noncontrast head CT, MRI brain with and without contrast if head CT is negative, EEG, and continued Keppra.  Review of Systems:  Unable to complete ROS secondary the patient's clinical condition.  History reviewed. No pertinent past medical history.  History reviewed. No pertinent surgical history.   reports that he has never smoked. He has never used smokeless tobacco. He reports that he does not drink alcohol or use drugs.  No Known Allergies  History reviewed. No pertinent family history.   Prior to Admission medications   Medication Sig  Start Date End Date Taking? Authorizing Provider  ibuprofen (ADVIL,MOTRIN) 200 MG tablet Take 800 mg by mouth every 6 (six) hours as needed for mild pain.    [provider]  levofloxacin (LEVAQUIN) 500 MG tablet Take 1 tablet (500 mg total) by mouth daily. 08/14/15   Pixie Casino, MD    Physical Exam: Vitals:   11/22/19 0018 11/22/19 0020 11/22/19 0305  BP:  130/73 136/74  Pulse:  74 80  Resp:  14 18  Temp:  97.8 F (36.6 C) 97.8 F (36.6 C)  TempSrc:  Oral Axillary  SpO2: 100% 98% 94%     Constitutional: No acute respiratory distress, lethargic   Eyes: PERTLA, lids and conjunctivae normal ENMT: Mucous membranes are moist. Posterior pharynx clear of any exudate or lesions.   Neck: normal, supple, no masses, no thyromegaly Respiratory: clear to auscultation bilaterally, no wheezing, no crackles. No accessory muscle use.  Cardiovascular: S1 & S2 heard, regular rate and rhythm. No extremity edema. Abdomen: No distension, no tenderness, soft. Bowel sounds active.  Musculoskeletal: no clubbing / cyanosis. No joint deformity upper and lower extremities.   Skin: no significant rashes, lesions, ulcers. Warm, dry, well-perfused. Neurologic: No facial asymmetry, PERRL. Sensation intact. Moving all extremities spontaneously. Not following commands or speaking.    Labs and Imaging on Admission: I have personally reviewed following labs and imaging studies  CBC: Recent Labs  Lab 11/22/19 0109  WBC 15.5*  NEUTROABS 8.8*  HGB 12.8*  HCT 41.0  MCV 87.2  PLT 281   Basic Metabolic Panel: Recent Labs  Lab 11/22/19 0109  NA 139  K 4.4  CL 103  CO2 17*  GLUCOSE 190*  BUN 13  CREATININE 1.18  CALCIUM 8.4*   GFR: CrCl cannot be calculated (Unknown ideal weight.). Liver Function Tests: Recent Labs  Lab 11/22/19 0109  AST 44*  ALT 19  ALKPHOS 54  BILITOT 0.9  PROT 7.8  ALBUMIN 3.8   No results for input(s): LIPASE, AMYLASE in the last 168 hours. No results for  input(s): AMMONIA in the last 168 hours. Coagulation Profile: No results for input(s): INR, PROTIME in the last 168 hours. Cardiac Enzymes: No results for input(s): CKTOTAL, CKMB, CKMBINDEX, TROPONINI in the last 168 hours. BNP (last 3 results) No results for input(s): PROBNP in the last 8760 hours. HbA1C: No results for input(s): HGBA1C in the last 72 hours. CBG: Recent Labs  Lab 11/22/19 0056  GLUCAP 135*   Lipid Profile: No results for input(s): CHOL, HDL, LDLCALC, TRIG, CHOLHDL, LDLDIRECT in the last 72 hours. Thyroid Function Tests: No results for input(s): TSH, T4TOTAL, FREET4, T3FREE, THYROIDAB in the last 72 hours. Anemia Panel: No results for input(s): VITAMINB12, FOLATE, FERRITIN, TIBC, IRON, RETICCTPCT in the last 72 hours. Urine analysis: No results found for: COLORURINE, APPEARANCEUR, LABSPEC, PHURINE, GLUCOSEU, HGBUR, BILIRUBINUR, KETONESUR, PROTEINUR, UROBILINOGEN, NITRITE, LEUKOCYTESUR Sepsis Labs: @LABRCNTIP (procalcitonin:4,lacticidven:4) )No results found for this or any previous visit (from the past 240 hour(s)).   Radiological Exams on Admission: No results found.  EKG: Independently reviewed. Sinus rhythm, RSR' in V1.   Assessment/Plan   1. First seizure  - Presents after being found poorly responsive, had generalized seizure in ED  - Per friend at bedside, pt does not have seizure hx, does not use EtOH or illicit substances, and had been in his usual state the evening of 11/21/19  - Patient was treated with Ativan and Keppra 1g IV in ED and neurology was consulted  - Head CT is pending, to be followed by MRI brain with and without if negative  - Continue seizure precautions, EEG, and Keppra 500 mg BID per neurology recommendations    2. Hyperglycemia  - Serum glucose is 190, no documented hx of DM - Check A1c, treat with low-intensity SSI if needed   3. Leukocytosis  - No infectious s/s, likely reactive to #1  - Culture if febrile, repeat CBC in am       DVT prophylaxis: SCD's until head CT, then could start pharmacologic ppx  Code Status: Full  Family Communication: Family friend updated at bedside Disposition Plan: Likely back home once seizures controlled.  Consults called: Neurology Admission status: Observation     11/23/19, MD [facility] Pager: See [email or web address]  If 7AM-7PM, please contact the daytime attending [email or web address]  11/22/2019, 4:33 AM

## 2019-11-22 NOTE — ED Provider Notes (Addendum)
TIME SEEN: 12:25 AM  CHIEF COMPLAINT: Possible seizure  HPI: Patient is a 59 year old male with no significant past medical history who speaks Hausa who presents to the emergency department with [facility] for possible seizure.  His family friend is at bedside who witnessed this episode.  States that he came home from work around 9:30 PM and states he was in his normal state of health.  Friend says that he was sitting in a chair when he became unresponsive with sonorous respirations and had foaming at the mouth.  He did not witness any seizure activity.  There was no tongue biting or incontinence.  Patient seemed confused when he finally came to when EMS arrived.  Patient states he does not remember anything until coming here to the emergency department.  He denies headache, chest pain, abdominal pain, neck or back pain, numbness or focal weakness.  No history of seizures.  He is not on any medications.  No drug or alcohol use.  No recent fevers, cough, vomiting, diarrhea.  He works at [employer].  No [place] interpretor available.  Family friend at bedside interpreting.   ROS: See HPI Constitutional: no fever  Eyes: no drainage  ENT: no runny nose   Cardiovascular:  no chest pain  Resp: no SOB  GI: no vomiting GU: no dysuria Integumentary: no rash  Allergy: no hives  Musculoskeletal: no leg swelling  Neurological: no slurred speech ROS otherwise negative  PAST MEDICAL HISTORY/PAST SURGICAL HISTORY:  History reviewed. No pertinent past medical history.  MEDICATIONS:  Prior to Admission medications   Medication Sig Start Date End Date Taking? Authorizing Provider  ibuprofen (ADVIL,MOTRIN) 200 MG tablet Take 800 mg by mouth every 6 (six) hours as needed for mild pain.    [provider]  levofloxacin (LEVAQUIN) 500 MG tablet Take 1 tablet (500 mg total) by mouth daily. 08/14/15   Mabe, Latanya Maudlin, MD    ALLERGIES:  No Known Allergies  SOCIAL HISTORY:  Social History    Tobacco Use  . Smoking status: Never Smoker  . Smokeless tobacco: Never Used  Substance Use Topics  . Alcohol use: No    Alcohol/week: 0.0 standard drinks    FAMILY HISTORY: History reviewed. No pertinent family history.  EXAM: BP 130/73   Pulse 74   Temp 97.8 F (36.6 C) (Oral)   Resp 14   SpO2 98%  CONSTITUTIONAL: Alert and oriented x 3 and responds appropriately to questions. Well-appearing; well-nourished HEAD: Normocephalic EYES: Conjunctivae clear, pupils appear equal, EOM appear intact ENT: normal nose; moist mucous membranes NECK: Supple, normal ROM CARD: RRR; S1 and S2 appreciated; no murmurs, no clicks, no rubs, no gallops RESP: Normal chest excursion without splinting or tachypnea; breath sounds clear and equal bilaterally; no wheezes, no rhonchi, no rales, no hypoxia or respiratory distress, speaking full sentences ABD/GI: Normal bowel sounds; non-distended; soft, non-tender, no rebound, no guarding, no peritoneal signs, no hepatosplenomegaly BACK:  The back appears normal EXT: Normal ROM in all joints; no deformity noted, no edema; no cyanosis SKIN: Normal color for age and race; warm; no rash on exposed skin NEURO: Moves all extremities equally, no pronator drift, sensation to light touch intact diffusely, cranial nerves II through XII intact, normal speech PSYCH: The patient's mood and manner are appropriate.   MEDICAL DECISION MAKING: Patient here after possible seizure.  After interviewing the patient and examining him, called back into the room by patient's friend as patient began to have a generalized tonic-clonic seizure.  When I walked into the room patient is no longer tense or shaking but friend reports that he was tense and shaking in all 4 extremities.  Currently with sonorous respirations and facial twitching with a right gaze preference.  Given 2 mg of IV Ativan and will start 1 g of IV Keppra.  Will obtain labs, urine, CT of the head.  Blood glucose  normal with EMS and here in the ED.  EKG shows no ischemia, arrhythmia, or interval abnormality.  ED PROGRESS: Patient continues to be postictal and agitated.  He received another milligram of IV Ativan in an attempt to obtain his head CT without any relief.  I feel he will need further sedation to obtain urinalysis, head CT.  Given he has not come back to his baseline and has now been at least 2 hours since his seizure, I feel he will need admission.  Will discuss with neurology to have them on board and to see if they would like to see patient before he is given more sedation.   3:35 AM  Spoke with Dr. Otelia Limes with neurology.  Neurology will see patient in consult.  Agrees with loading with IV Keppra and starting Keppra 500 mg twice daily.  Recommends holding on head CT obtaining MRI with and without contrast once patient more sedate.  Will order IV Haldol.  Agrees with medicine admission.  Will need EEG in the morning.   Neurology evaluated patient and recommends now obtaining head CT but still getting MRI with and without contrast and EEG in the morning.  Friend at bedside has been updated.  Patient still slightly agitated but able to follow commands at this time.  Still hemodynamically stable without further seizure activity.  Will give IV Haldol for sedation prior to obtaining catheterized urine specimen and head CT.   3:54 AM Discussed patient's case with hospitalist, Dr. Antionette Char.  I have recommended admission and patient (and family if present) agree with this plan. Admitting physician will place admission orders.   Hospitalist is okay following up on head CT.  I reviewed all nursing notes, vitals, pertinent previous records and interpreted all EKGs, lab and urine results, imaging (as available).    EKG Interpretation  Date/Time:  Wednesday November 22 2019 01:13:18 EST Ventricular Rate:  90 PR Interval:    QRS Duration: 103 QT Interval:  381 QTC Calculation: 467 R Axis:   -60 Text  Interpretation: Sinus rhythm LAD, consider left anterior fascicular block RSR' in V1 or V2, right VCD or RVH No significant change since last tracing Confirmed by Ward, Baxter Hire [phone]) on 11/22/2019 1:25:00 AM        CRITICAL CARE Performed by: Baxter Hire Ward   Total critical care time: 50 minutes  Critical care time was exclusive of separately billable procedures and treating other patients.  Critical care was necessary to treat or prevent imminent or life-threatening deterioration.  Critical care was time spent personally by me on the following activities: development of treatment plan with patient and/or surrogate as well as nursing, discussions with consultants, evaluation of patient's response to treatment, examination of patient, obtaining history from patient or surrogate, ordering and performing treatments and interventions, ordering and review of laboratory studies, ordering and review of radiographic studies, pulse oximetry and re-evaluation of patient's condition.   Joe Bell was evaluated in Emergency Department on 11/22/2019 for the symptoms described in the history of present illness. He was evaluated in the context of the global COVID-19 pandemic, which necessitated consideration that  the patient might be at risk for infection with the SARS-CoV-2 virus that causes COVID-19. Institutional protocols and algorithms that pertain to the evaluation of patients at risk for COVID-19 are in a state of rapid change based on information released by regulatory bodies including the CDC and federal and state organizations. These policies and algorithms were followed during the patient's care in the ED.  Patient was seen wearing N95, face shield, gloves.    Ward, Delice Bison, DO 11/22/19 0354    Ward, Delice Bison, DO 11/22/19 [phone]

## 2019-11-22 NOTE — Consult Note (Signed)
NEURO HOSPITALIST CONSULT NOTE   Requestig physician: Dr. Elesa Massed  Reason for Consult: New onset seizures  History obtained from:  Friend and Chart     HPI:                                                                                                                                          Joe Bell is an 59 y.o. male originally from [place], with limited [place] language use (speaks Hausa), who presents via EMS after a new onset seizure at home. He was found on the couch by family with trouble breathing, unable to wake up. An somewhat different historical account in the notes documents him sitting in a chair and then becoming unresponsive with sonorous respirations and foaming at the mouth, but no overt seizure activity and no tongue biting or incontinence. EMS noted the patient to be unconscious but breathing on their arrival. He then became alert and slowly started to try to communicate with EMS. Per his friend, he has no history of seizures. He works at a [employer]. He has no recent symptoms of infection. Was not complaining of a headache, neck stiffness or other neurological symptoms prior to being found unresponsive.   He was able to relate to ED staff that he did not remember the spell or anything else until coming to the ED. He at that time denied headache, chest pain, abdominal pain, neck or back pain, numbness or focal weakness, fevers, cough, vomiting, or diarrhea. He also denied any drug or EtOH use. He is not taking any medications.   In the ED, he had a witnessed GTC seizure with shaking of all 4 extremities. After cessation of the gross motor activity, some facial twitching with right gaze preference was noted, in addition to decreased responsiveness and sonorous respirations.   History reviewed. No pertinent past medical history.  History reviewed. No pertinent surgical history.  History reviewed. No pertinent family history.            Social History:   reports that he has never smoked. He has never used smokeless tobacco. He reports that he does not drink alcohol or use drugs.  No Known Allergies  HOME MEDICATIONS:  None   ROS:                                                                                                                                       As per HPI.    Blood pressure 136/74, pulse 80, temperature 97.8 F (36.6 C), temperature source Axillary, resp. rate 18, SpO2 94 %.   General Examination:                                                                                                       Physical Exam  HEENT-  [place]/AT   Lungs- Respirations unlabored Extremities- No edema  Neurological Examination Mental Status: Somnolent. Will briefly awaken to voice and touch and will follow about 3 simple commands before falling asleep again. Limited single word answers in his native language to questions translated by his friend.  Cranial Nerves: II: Briefly fixated on examiner's face. PERRL.   III,IV, VI: No ptosis. Gazes to left and right when briefly awake.   V,VII: Facial movement is symmetric. Unable to formally test facial sensation  VIII: hearing intact to voice IX,X: Unable to assess XI: No gross asymmetry XII: Unable to assess Motor: Moves all 4 extremities with equal strength, speed and amplitude during spontaneous writhing movements in the bed. He appears uncomfortable but denies pain.  Sensory: Unable to formally assess except for movement of limbs touched.  Deep Tendon Reflexes: 1+ brachioradialis and patellae bilaterally Plantars: Mute bilaterally  Cerebellar/Gait: Unable to assess   Lab Results: Basic Metabolic Panel: Recent Labs  Lab 11/22/19 0109  NA 139  K 4.4  CL 103  CO2 17*  GLUCOSE 190*  BUN 13  CREATININE 1.18  CALCIUM 8.4*    CBC: Recent Labs  Lab 11/22/19 0109   WBC 15.5*  NEUTROABS 8.8*  HGB 12.8*  HCT 41.0  MCV 87.2  PLT 281    Cardiac Enzymes: No results for input(s): CKTOTAL, CKMB, CKMBINDEX, TROPONINI in the last 168 hours.  Lipid Panel: No results for input(s): CHOL, TRIG, HDL, CHOLHDL, VLDL, LDLCALC in the last 168 hours.  Imaging: No results found.   Assessment: 59 year old male presenting with new-onset seizures 1. Exam findings most consistent with postictal state. Per Dr. Elesa Massed, he is somewhat improved since her last evaluation.  2. No neck stiffness or fever to suggest a meningitis. He has a leukocytosis which is more likely to be secondary to his seizures rather than an infection given no recent symptoms of  infection.  3. Labs reveal no findings to suggest a provoked seizure. Per his friend, the patient does not drink EtOH.   Recommendations: 1. CT head. If negative, obtain MRI brain with and without contrast.  2. EEG (ordered) 3. Has been loaded with 1000 mg IV Keppra. Continue with 500 mg BID IV or PO.    Electronically signed: Dr. Kerney Elbe 11/22/2019, 3:44 AM

## 2019-11-22 NOTE — Plan of Care (Signed)

## 2019-11-22 NOTE — Progress Notes (Signed)
EEG complete - results pending 

## 2019-11-22 NOTE — Progress Notes (Signed)
NEUROLOGY PROGRESS NOTE   Subjective: Family members in room and able to translate.  Patient's only complaint is that he has severe pain in his right shoulder.  Patient states that he does not know of any seizures in the past.  Exam: Vitals:   11/22/19 0523 11/22/19 0818  BP: (!) 158/80 (!) 157/103  Pulse: 97 93  Resp: 20 20  Temp: 99.4 F (37.4 C) 98.3 F (36.8 C)  SpO2:  98%    ROS General ROS: negative for - chills, fatigue, fever, night sweats, weight gain or weight loss Psychological ROS: negative for - behavioral disorder, hallucinations, memory difficulties, mood swings or suicidal ideation Ophthalmic ROS: negative for - blurry vision, double vision, eye pain or loss of vision ENT ROS: negative for - epistaxis, nasal discharge, oral lesions, sore throat, tinnitus or vertigo Allergy and Immunology ROS: negative for - hives or itchy/watery eyes Hematological and Lymphatic ROS: negative for - bleeding problems, bruising or swollen lymph nodes Endocrine ROS: negative for - galactorrhea, hair pattern changes, polydipsia/polyuria or temperature intolerance Respiratory ROS: negative for - cough, hemoptysis, shortness of breath or wheezing Cardiovascular ROS: negative for - chest pain, dyspnea on exertion, edema or irregular heartbeat Gastrointestinal ROS: negative for - abdominal pain, diarrhea, hematemesis, nausea/vomiting or stool incontinence Genito-Urinary ROS: negative for - dysuria, hematuria, incontinence or urinary frequency/urgency Musculoskeletal ROS: Positive for - joint swelling and pain in right shoulder Neurological ROS: as noted in HPI Dermatological ROS: negative for rash and skin lesion changes    Physical Exam  Constitutional: Appears well-developed and well-nourished.  Eyes: No scleral injection HENT: No OP obstrucion Head: Normocephalic.  Cardiovascular: Normal rate and regular rhythm.  Respiratory: Effort normal, non-labored breathing GI: Soft.  No  distension. There is no tenderness.  Skin: WDI   Neuro:  Mental Status: Alert, oriented to hospital.  Speech fluent without evidence of aphasia.  Able to follow 3 step commands without difficulty. Cranial Nerves: II:  Visual fields grossly normal,  III,IV, VI: ptosis not present, extra-ocular motions intact bilaterally pupils equal, round, reactive to light and accommodation V,VII: smile symmetric, facial light touch sensation normal bilaterally VIII: hearing normal bilaterally  Motor: Right : Upper extremity   see note   left:     Upper extremity   5/5  Lower extremity   5/5     Lower extremity   5/5 -Can barely move right arm secondary to severe shoulder pain.  Shoulder looks like his been dislocated at this point in time.  He has good grip and good distal pulse. Tone and bulk:normal tone throughout; no atrophy noted Sensory: Pinprick and light touch intact throughout, bilaterally Deep Tendon Reflexes: 1+ throughout    Medications:  Scheduled: . insulin aspart  0-9 Units Subcutaneous Q4H  . levETIRAcetam  500 mg Oral BID  . sodium chloride flush  3 mL Intravenous Q12H   Continuous: . sodium chloride 75 mL/hr at 11/22/19 0550   LEX:[id number] **OR** acetaminophen, ondansetron **OR** ondansetron (ZOFRAN) IV  Pertinent Labs/Diagnostics:   CT Head Wo Contrast  Result Date: 11/22/2019 CLINICAL DATA:  Nontraumatic seizure EXAM: CT HEAD WITHOUT CONTRAST TECHNIQUE: Contiguous axial images were obtained from the base of the skull through the vertex without intravenous contrast. COMPARISON:  None. FINDINGS: Brain: No evidence of acute infarction, hemorrhage, hydrocephalus, extra-axial collection or mass lesion/mass effect. Normal cortical finding to explain seizure. Vascular: No hyperdense vessel or unexpected calcification. Skull: Negative Sinuses/Orbits: Negative IMPRESSION: Negative head CT. Electronically Signed   By: Angelica Chessman  Watts M.D.   On: 11/22/2019 04:47     Impression: This 59 year old male with findings consistent of initial postictal state likely secondary to new onset seizure.  At this time patient is awake and alert.  Able to hold full sentences and conversation with family member who translates.  Exam is nonfocal other than the fact patient has what looks like a dislocated right shoulder.  Recommendations: -EEG -MRI without contrast -Continue Keppra 500 mg twice daily -Seizure precautions -I have contacted the primary physician to obtain x-rays of right shoulder to evaluate for anterior dislocated shoulder   Felicie Morn PA-C [facility] [phone]    11/22/2019, 10:15 AM  I have seen the patient and reviewed the above note. Keppra started due to prolonged post-ictal state, but with only a single episode, not sure if longe term AED is indicated unles MRI or EEG shows evidence of need. Will follow.   Ritta Slot, MD [facility] [phone]  If 7pm- 7am, please page neurology on call as listed in AMION.

## 2019-11-23 ENCOUNTER — Observation Stay (HOSPITAL_COMMUNITY): Payer: Self-pay

## 2019-11-23 LAB — CBC WITH DIFFERENTIAL/PLATELET
Abs Immature Granulocytes: 0.03 10*3/uL (ref 0.00–0.07)
Basophils Absolute: 0 10*3/uL (ref 0.0–0.1)
Basophils Relative: 0 %
Eosinophils Absolute: 0 10*3/uL (ref 0.0–0.5)
Eosinophils Relative: 0 %
HCT: 38.1 % — ABNORMAL LOW (ref 39.0–52.0)
Hemoglobin: 12.2 g/dL — ABNORMAL LOW (ref 13.0–17.0)
Immature Granulocytes: 0 %
Lymphocytes Relative: 28 %
Lymphs Abs: 2.5 10*3/uL (ref 0.7–4.0)
MCH: 26.9 pg (ref 26.0–34.0)
MCHC: 32 g/dL (ref 30.0–36.0)
MCV: 83.9 fL (ref 80.0–100.0)
Monocytes Absolute: 0.7 10*3/uL (ref 0.1–1.0)
Monocytes Relative: 8 %
Neutro Abs: 5.9 10*3/uL (ref 1.7–7.7)
Neutrophils Relative %: 64 %
Platelets: 229 10*3/uL (ref 150–400)
RBC: 4.54 MIL/uL (ref 4.22–5.81)
RDW: 12.1 % (ref 11.5–15.5)
WBC: 9.2 10*3/uL (ref 4.0–10.5)
nRBC: 0 % (ref 0.0–0.2)

## 2019-11-23 LAB — COMPREHENSIVE METABOLIC PANEL
ALT: 25 U/L (ref 0–44)
AST: 57 U/L — ABNORMAL HIGH (ref 15–41)
Albumin: 3.7 g/dL (ref 3.5–5.0)
Alkaline Phosphatase: 49 U/L (ref 38–126)
Anion gap: 11 (ref 5–15)
BUN: 11 mg/dL (ref 8–23)
CO2: 25 mmol/L (ref 22–32)
Calcium: 8.3 mg/dL — ABNORMAL LOW (ref 8.9–10.3)
Chloride: 102 mmol/L (ref 98–111)
Creatinine, Ser: 0.98 mg/dL (ref 0.61–1.24)
GFR calc Af Amer: >60 mL/min (ref >60)
GFR calc non Af Amer: >60 mL/min (ref >60)
Glucose, Bld: 119 mg/dL — ABNORMAL HIGH (ref 70–99)
Potassium: 3.7 mmol/L (ref 3.5–5.1)
Sodium: 138 mmol/L (ref 135–145)
Total Bilirubin: 1.2 mg/dL (ref 0.3–1.2)
Total Protein: 7.5 g/dL (ref 6.5–8.1)

## 2019-11-23 LAB — GLUCOSE, CAPILLARY
Glucose-Capillary: 107 mg/dL — ABNORMAL HIGH (ref 70–99)
Glucose-Capillary: 118 mg/dL — ABNORMAL HIGH (ref 70–99)
Glucose-Capillary: 119 mg/dL — ABNORMAL HIGH (ref 70–99)
Glucose-Capillary: 121 mg/dL — ABNORMAL HIGH (ref 70–99)
Glucose-Capillary: 83 mg/dL (ref 70–99)

## 2019-11-23 LAB — HEMOGLOBIN A1C
Hgb A1c MFr Bld: 5 % (ref 4.8–5.6)
Mean Plasma Glucose: 96.8 mg/dL

## 2019-11-23 LAB — HIV ANTIBODY (ROUTINE TESTING W REFLEX): HIV Screen 4th Generation wRfx: NONREACTIVE

## 2019-11-23 MED ORDER — HYDROCODONE-ACETAMINOPHEN 5-325 MG PO TABS
1.0000 | ORAL_TABLET | Freq: Four times a day (QID) | ORAL | Status: DC | PRN
  Administered 2019-11-23: 22:00:00 2 via ORAL
  Administered 2019-11-23: 12:00:00 1 via ORAL
  Filled 2019-11-23: qty 2
  Filled 2019-11-23: qty 1

## 2019-11-23 MED ORDER — TRAMADOL HCL 50 MG PO TABS: 50.0000 mg | ORAL_TABLET | Freq: Four times a day (QID) | ORAL | Status: DC | PRN

## 2019-11-23 MED ORDER — TRAMADOL HCL 50 MG PO TABS
50.0000 mg | ORAL_TABLET | Freq: Once | ORAL | Status: AC
  Administered 2019-11-23: 50 mg via ORAL
  Filled 2019-11-23: qty 1

## 2019-11-23 MED ORDER — LORAZEPAM 2 MG/ML IJ SOLN
1.0000 mg | Freq: Once | INTRAMUSCULAR | Status: AC | PRN
  Administered 2019-11-23: 12:00:00 1 mg via INTRAVENOUS
  Filled 2019-11-23: qty 1

## 2019-11-23 NOTE — Progress Notes (Signed)
PROGRESS NOTE  Joe Bell XBM:[id number] DOB: 31-Aug-1961 DOA: 11/22/2019 PCP: Patient, No Pcp Per  Brief History   This patient was admitted by my colleague, Dr. Dr. Antionette Bell early this morning from the ED after he was found to be poorly responsive with agonal respirations at home. When EMS arrived he became more alert and was brought to the ED where he was found to have generalized tocnic-clonic seizure in the ED.   [facility] were consulted to admit the patient for further evaluation and care.  Neurology was consulted. The patient has been loaded with keppra and started on a regular dose. EEG is pending.  On 11/22/2019 the patient complained of pain in his right shoulder. CT of the right shoulder was performed this morning based upon an irregularity seen on x-ray performed yesterday. Orthopedic surgery was consulted. They plan to take the patient to surgery tomorrow to reduce the fracture.  Consultants  . Orthopedic surgery . Neurology  Procedures  . EEG  Antibiotics   Anti-infectives (From admission, onward)   None    .   Subjective  The patient is seen with the assistance of an interpretor. He is complaining of right shoulder pain.  Objective   Vitals:  Vitals:   11/23/19 1130 11/23/19 1701  BP: (!) 140/92 (!) 139/98  Pulse: 79 91  Resp: 17 16  Temp: 97.9 F (36.6 C) 98.6 F (37 C)  SpO2: 96% 94%   Exam:  Constitutional:  . The patient is awake, alert, and oriented x 3. No acute distress. Respiratory:  . No increased work of breathing. . No wheezes, rales, or rhonchi . No tactile fremitus Cardiovascular:  . Regular rate and rhythm . No murmurs, ectopy, or gallups. . No lateral PMI. No thrills. Abdomen:  . Abdomen is soft, non-tender, non-distended . No hernias, masses, or organomegaly . Normoactive bowel sounds.  Musculoskeletal:  . No cyanosis, clubbing, or edema . Right shoulder is swollen and painful to palpation or movement. Skin:  . No  rashes, lesions, ulcers . palpation of skin: no induration or nodules Neurologic:  . CN 2-12 intact . Sensation all 4 extremities intact Psychiatric:  . Mental status o Mood, affect appropriate o Orientation to person, place, time  . judgment and insight appear intact   I have personally reviewed the following:   Today's Data  . Vitals, CMP, CBC  Other Data  . EEG - negative MRI brain  - negative.  Scheduled Meds: . insulin aspart  0-9 Units Subcutaneous Q4H  . levETIRAcetam  500 mg Oral BID  . sodium chloride flush  3 mL Intravenous Q12H   Continuous Infusions: . dextrose 50 mL/hr at 11/22/19 1147    Principal Problem:   First time seizure (HCC) Active Problems:   Hyperglycemia   Leukocytosis   LOS: 0 days   A & P  Seizures: Isolated single seizure. No epilleptiform activity on EEG. No abnormality on MRI. Dr. Amada Bell does not feel that the patient required further work up or antiepileptic medication.  Fracture of the Right Shoulder: Orthopedic surgery will take the patient to surgery in the morning to reduce this. I appreciate their assistance. I will continue the patient on keppra in the perioperative period.  Hyperglycemia: Reactive hyperglycemia  Leukocytosis: Reactive due to seizure and fracture of right shoulder.  I have seen and examined this patient myself. I have spent 46 minutes in his evaluation and care.  DVT prophylaxis: SCD's until head CT, then could start pharmacologic ppx  Code Status: Full  Family Communication: None Disposition Plan: Home following surgery when cleared for discharge by surgery.   Joe Oscar, DO [facility] Direct contact: see [email or web address]  7PM-7AM contact night coverage as above 11/23/2019, 5:36 PM  LOS: 0 days

## 2019-11-23 NOTE — Progress Notes (Signed)
Orthopedic Tech Progress Note Patient Details:  Deyton Ellenbecker 1961/08/24 [id number] Applied a SHOULDER IMMOBILIZER per ORTHOPEDIC PA M. JEFFERY [employer] Type of Ortho Device: Shoulder immobilizer Ortho Device/Splint Location: RUE Ortho Device/Splint Interventions: Ordered, Application   Post Interventions Patient Tolerated: Well Instructions Provided: Care of device, Adjustment of device   Donald Pore 11/23/2019, 4:46 PM

## 2019-11-23 NOTE — H&P (View-Only) (Signed)
Reason for Consult:Right shoulder fx Referring Physician: A Swayze  Joe Bell is an 59 y.o. male.  HPI: Joe Bell was admitted yesterday with his first tonic-clonic seizure. Workup is still pending as to cause. Though he was denying pain yesterday when he arrived he began to c/o right shoulder pain later in the day. X-rays showed a possible fx and CT scan confirmed and orthopedic surgery was consulted. Pt does not speak English and visit was conducted through personal interpreter.  History reviewed. No pertinent past medical history.  History reviewed. No pertinent surgical history.  History reviewed. No pertinent family history.  Social History:  reports that he has never smoked. He has never used smokeless tobacco. He reports that he does not drink alcohol or use drugs.  Allergies: No Known Allergies  Medications: I have reviewed the patient's current medications.  Results for orders placed or performed during the hospital encounter of 11/22/19 (from the past 48 hour(s))  CBG monitoring, ED     Status: Abnormal   Collection Time: 11/22/19 12:56 AM  Result Value Ref Range   Glucose-Capillary 135 (H) 70 - 99 mg/dL  CBC with Differential     Status: Abnormal   Collection Time: 11/22/19  1:09 AM  Result Value Ref Range   WBC 15.5 (H) 4.0 - 10.5 K/uL   RBC 4.70 4.22 - 5.81 MIL/uL   Hemoglobin 12.8 (L) 13.0 - 17.0 g/dL   HCT 86.7 67.2 - 09.4 %   MCV 87.2 80.0 - 100.0 fL   MCH 27.2 26.0 - 34.0 pg   MCHC 31.2 30.0 - 36.0 g/dL   RDW 70.9 62.8 - 36.6 %   Platelets 281 150 - 400 K/uL   nRBC 0.0 0.0 - 0.2 %   Neutrophils Relative % 57 %   Neutro Abs 8.8 (H) 1.7 - 7.7 K/uL   Lymphocytes Relative 36 %   Lymphs Abs 5.6 (H) 0.7 - 4.0 K/uL   Monocytes Relative 6 %   Monocytes Absolute 0.9 0.1 - 1.0 K/uL   Eosinophils Relative 1 %   Eosinophils Absolute 0.1 0.0 - 0.5 K/uL   Basophils Relative 0 %   Basophils Absolute 0.0 0.0 - 0.1 K/uL   Immature Granulocytes 0 %   Abs Immature Granulocytes  0.06 0.00 - 0.07 K/uL    Comment: Performed at [facility], [address] [place], [place] [address]  Comprehensive metabolic panel     Status: Abnormal   Collection Time: 11/22/19  1:09 AM  Result Value Ref Range   Sodium 139 135 - 145 mmol/L   Potassium 4.4 3.5 - 5.1 mmol/L   Chloride 103 98 - 111 mmol/L   CO2 17 (L) 22 - 32 mmol/L   Glucose, Bld 190 (H) 70 - 99 mg/dL   BUN 13 8 - 23 mg/dL   Creatinine, Ser 5.46 0.61 - 1.24 mg/dL   Calcium 8.4 (L) 8.9 - 10.3 mg/dL   Total Protein 7.8 6.5 - 8.1 g/dL   Albumin 3.8 3.5 - 5.0 g/dL   AST 44 (H) 15 - 41 U/L   ALT 19 0 - 44 U/L   Alkaline Phosphatase 54 38 - 126 U/L   Total Bilirubin 0.9 0.3 - 1.2 mg/dL   GFR calc non Af Amer >60 >60 mL/min   GFR calc Af Amer >60 >60 mL/min   Anion gap 19 (H) 5 - 15    Comment: Performed at [facility], [address] [place], [place] [address]  Ethanol     Status: None   Collection Time: 11/22/19  1:09 AM  Result Value Ref Range   Alcohol, Ethyl (B) <10 <10 mg/dL    Comment: (NOTE) Lowest detectable limit for serum alcohol is 10 mg/dL. For medical purposes only. Performed at [facility], [address] [place], [place] [address]   SARS CORONAVIRUS 2 (TAT 6-24 HRS) Nasopharyngeal Nasopharyngeal Swab     Status: None   Collection Time: 11/22/19  2:52 AM   Specimen: Nasopharyngeal Swab  Result Value Ref Range   SARS Coronavirus 2 NEGATIVE NEGATIVE    Comment: (NOTE) SARS-CoV-2 target nucleic acids are NOT DETECTED. The SARS-CoV-2 RNA is generally detectable in upper and lower respiratory specimens during the acute phase of infection. Negative results do not preclude SARS-CoV-2 infection, do not rule out co-infections with other pathogens, and should not be used as the sole basis for treatment or other patient management decisions. Negative results must be combined with clinical observations, patient history, and epidemiological information. The expected result is  Negative. Fact Sheet for Patients: HairSlick.no Fact Sheet for Healthcare Providers: quierodirigir.com This test is not yet approved or cleared by the [place] FDA and  has been authorized for detection and/or diagnosis of SARS-CoV-2 by FDA under an Emergency Use Authorization (EUA). This EUA will remain  in effect (meaning this test can be used) for the duration of the COVID-19 declaration under Section 56 4(b)(1) of the Act, 21 U.S.C. section 360bbb-3(b)(1), unless the authorization is terminated or revoked sooner. Performed at [facility], [address], [place], [place] [address]   Urinalysis, Routine w reflex microscopic     Status: Abnormal   Collection Time: 11/22/19  4:21 AM  Result Value Ref Range   Color, Urine YELLOW YELLOW   APPearance HAZY (A) CLEAR   Specific Gravity, Urine 1.019 1.005 - 1.030   pH 5.0 5.0 - 8.0   Glucose, UA 50 (A) NEGATIVE mg/dL   Hgb urine dipstick SMALL (A) NEGATIVE   Bilirubin Urine NEGATIVE NEGATIVE   Ketones, ur NEGATIVE NEGATIVE mg/dL   Protein, ur NEGATIVE NEGATIVE mg/dL   Nitrite NEGATIVE NEGATIVE   Leukocytes,Ua NEGATIVE NEGATIVE   RBC / HPF 0-5 0 - 5 RBC/hpf   WBC, UA 0-5 0 - 5 WBC/hpf   Bacteria, UA RARE (A) NONE SEEN   Squamous Epithelial / LPF 0-5 0 - 5   Mucus PRESENT    Hyaline Casts, UA PRESENT    Sperm, UA PRESENT     Comment: Performed at [facility], [address], [place], [place] [address]  Rapid urine drug screen (hospital performed)     Status: Abnormal   Collection Time: 11/22/19  4:21 AM  Result Value Ref Range   Opiates NONE DETECTED NONE DETECTED   Cocaine NONE DETECTED NONE DETECTED   Benzodiazepines POSITIVE (A) NONE DETECTED   Amphetamines NONE DETECTED NONE DETECTED   Tetrahydrocannabinol NONE DETECTED NONE DETECTED   Barbiturates NONE DETECTED NONE DETECTED    Comment: (NOTE) DRUG SCREEN FOR MEDICAL PURPOSES ONLY.  IF CONFIRMATION  IS NEEDED FOR ANY PURPOSE, NOTIFY LAB WITHIN 5 DAYS. LOWEST DETECTABLE LIMITS FOR URINE DRUG SCREEN Drug Class                     Cutoff (ng/mL) Amphetamine and metabolites    1000 Barbiturate and metabolites    200 Benzodiazepine  200 Tricyclics and metabolites     300 Opiates and metabolites        300 Cocaine and metabolites        300 THC                            50 Performed at [facility], [address], [place], [place] [address]   Glucose, capillary     Status: Abnormal   Collection Time: 11/22/19  5:32 AM  Result Value Ref Range   Glucose-Capillary 104 (H) 70 - 99 mg/dL  Glucose, capillary     Status: None   Collection Time: 11/22/19  9:59 AM  Result Value Ref Range   Glucose-Capillary 88 70 - 99 mg/dL  Glucose, capillary     Status: Abnormal   Collection Time: 11/22/19 11:26 AM  Result Value Ref Range   Glucose-Capillary 102 (H) 70 - 99 mg/dL  Glucose, capillary     Status: None   Collection Time: 11/22/19  3:38 PM  Result Value Ref Range   Glucose-Capillary 90 70 - 99 mg/dL  Glucose, capillary     Status: Abnormal   Collection Time: 11/22/19  8:15 PM  Result Value Ref Range   Glucose-Capillary 111 (H) 70 - 99 mg/dL  Glucose, capillary     Status: Abnormal   Collection Time: 11/23/19 12:38 AM  Result Value Ref Range   Glucose-Capillary 121 (H) 70 - 99 mg/dL  HIV Antibody (routine testing w rflx)     Status: None   Collection Time: 11/23/19  2:13 AM  Result Value Ref Range   HIV Screen 4th Generation wRfx NON REACTIVE NON REACTIVE    Comment: Performed at [facility], [address] [place], [place] [address]  Hemoglobin A1c     Status: None   Collection Time: 11/23/19  2:13 AM  Result Value Ref Range   Hgb A1c MFr Bld 5.0 4.8 - 5.6 %    Comment: (NOTE) Pre diabetes:          5.7%-6.4% Diabetes:              >6.4% Glycemic control for   <7.0% adults with diabetes    Mean Plasma Glucose 96.8 mg/dL    Comment: Performed  at [facility], [address] [place], [place] [address]  CBC WITH DIFFERENTIAL     Status: Abnormal   Collection Time: 11/23/19  2:13 AM  Result Value Ref Range   WBC 9.2 4.0 - 10.5 K/uL   RBC 4.54 4.22 - 5.81 MIL/uL   Hemoglobin 12.2 (L) 13.0 - 17.0 g/dL   HCT 91.4 (L) 78.2 - 95.6 %   MCV 83.9 80.0 - 100.0 fL   MCH 26.9 26.0 - 34.0 pg   MCHC 32.0 30.0 - 36.0 g/dL   RDW 21.3 08.6 - 57.8 %   Platelets 229 150 - 400 K/uL   nRBC 0.0 0.0 - 0.2 %   Neutrophils Relative % 64 %   Neutro Abs 5.9 1.7 - 7.7 K/uL   Lymphocytes Relative 28 %   Lymphs Abs 2.5 0.7 - 4.0 K/uL   Monocytes Relative 8 %   Monocytes Absolute 0.7 0.1 - 1.0 K/uL   Eosinophils Relative 0 %   Eosinophils Absolute 0.0 0.0 - 0.5 K/uL   Basophils Relative 0 %   Basophils Absolute 0.0 0.0 - 0.1 K/uL   Immature Granulocytes 0 %   Abs  Immature Granulocytes 0.03 0.00 - 0.07 K/uL    Comment: Performed at [facility], [place] [address], [place], [place] [address]  Comprehensive metabolic panel     Status: Abnormal   Collection Time: 11/23/19  2:13 AM  Result Value Ref Range   Sodium 138 135 - 145 mmol/L   Potassium 3.7 3.5 - 5.1 mmol/L   Chloride 102 98 - 111 mmol/L   CO2 25 22 - 32 mmol/L   Glucose, Bld 119 (H) 70 - 99 mg/dL   BUN 11 8 - 23 mg/dL   Creatinine, Ser 0.98 0.61 - 1.24 mg/dL   Calcium 8.3 (L) 8.9 - 10.3 mg/dL   Total Protein 7.5 6.5 - 8.1 g/dL   Albumin 3.7 3.5 - 5.0 g/dL   AST 57 (H) 15 - 41 U/L   ALT 25 0 - 44 U/L   Alkaline Phosphatase 49 38 - 126 U/L   Total Bilirubin 1.2 0.3 - 1.2 mg/dL   GFR calc non Af Amer >60 >60 mL/min   GFR calc Af Amer >60 >60 mL/min   Anion gap 11 5 - 15    Comment: Performed at [facility] [address], [place], [place] [address]  Glucose, capillary     Status: Abnormal   Collection Time: 11/23/19  3:49 AM  Result Value Ref Range   Glucose-Capillary 118 (H) 70 - 99 mg/dL  Glucose, capillary     Status: Abnormal   Collection Time: 11/23/19  8:28  AM  Result Value Ref Range   Glucose-Capillary 107 (H) 70 - 99 mg/dL   Comment 1 Notify RN    Comment 2 Document in Chart     EEG  Result Date: 11/23/2019 Lora Havens, MD     11/23/2019  2:03 PM Patient Name: Lian Tanori MRN: [id number] Epilepsy Attending: Lora Havens Referring Physician/Provider: Dr. Mitzi Hansen Date: 11/22/2019 Duration: 26.02 minutes Patient history: 59 year old male presented with new onset seizure.  EEG to evaluate for epilepsy. Level of alertness: Awake, sleep AEDs during EEG study: Keppra, Ativan Technical aspects: This EEG study was done with scalp electrodes positioned according to the 10-20 International system of electrode placement. Electrical activity was acquired at a sampling rate of 500Hz  and reviewed with a high frequency filter of 70Hz  and a low frequency filter of 1Hz . EEG data were recorded continuously and digitally stored. Description: During awake state, no clear posterior dominant was seen.  Sleep was characterized by sleep spindles (12 to 14 Hz), maximal frontocentral as well as slow-wave sleep.  EEG also showed continuous generalized 3-5 theta-delta slowing admixed with an excessive amount of 15 to 18 Hz, 2-3 uV beta activity with irregular morphology distributed symmetrically and diffusely.  Hyperventilation and photic stimulation were not performed. Abnormality -Continuous slow, generalized -Excessive beta, generalized IMPRESSION: This study is suggestive of mild to moderate diffuse encephalopathy, nonspecific to etiology. The excessive beta activity seen in the background is most likely due to the effect of benzodiazepine and is a benign EEG pattern. No seizures or epileptiform discharges were seen throughout the recording. Lora Havens   DG Shoulder Right  Result Date: 11/22/2019 CLINICAL DATA:  Shoulder pain EXAM: RIGHT SHOULDER - 2+ VIEW COMPARISON:  None. FINDINGS: AC joint appears intact. No humeral head dislocation. Osseous deformity along  the inferior aspect of the humerus IMPRESSION: Osseous deformity along the inferior aspect of the right humeral head and neck, may relate to fracture deformity of uncertain age. CT could be  obtained for further evaluation. Electronically Signed   By: Kim  Fujinaga M.D.   On: 11/22/2019 17:39   DG Abd 1 View  Result Date: 11/22/2019 CLINICAL DATA:  Unresponsive EXAM: ABDOMEN - 1 VIEW COMPARISON:  None. FINDINGS: Non inclusion of the left upper quadrant. Nonobstructed gas pattern with moderate stool in the colon. No radiopaque foreign body. Probable phleboliths in the left pelvis. IMPRESSION: Negative, allowing for incomplete inclusion of the left upper quadrant of the abdomen Electronically Signed   By: Kim  Fujinaga M.D.   On: 11/22/2019 17:41   CT Head Wo Contrast  Result Date: 11/22/2019 CLINICAL DATA:  Nontraumatic seizure EXAM: CT HEAD WITHOUT CONTRAST TECHNIQUE: Contiguous axial images were obtained from the base of the skull through the vertex without intravenous contrast. COMPARISON:  None. FINDINGS: Brain: No evidence of acute infarction, hemorrhage, hydrocephalus, extra-axial collection or mass lesion/mass effect. Normal cortical finding to explain seizure. Vascular: No hyperdense vessel or unexpected calcification. Skull: Negative Sinuses/Orbits: Negative IMPRESSION: Negative head CT. Electronically Signed   By: Jonathon  Watts M.D.   On: 11/22/2019 04:47   MR BRAIN WO CONTRAST  Result Date: 11/23/2019 CLINICAL DATA:  Altered mental status. Seizures. EXAM: MRI HEAD WITHOUT CONTRAST TECHNIQUE: Multiplanar, multiecho pulse sequences of the brain and surrounding structures were obtained without intravenous contrast. COMPARISON:  None. FINDINGS: Images are severely degraded by motion. Brain: No acute infarction, hemorrhage, hydrocephalus, extra-axial collection or large mass lesion. Vascular: Normal flow voids. Skull and upper cervical spine: Normal marrow signal. Sinuses/Orbits: Negative. Other:  None. IMPRESSION: 1. Severely motion degraded study. 2. No acute intracranial abnormality identified. Electronically Signed   By: Katyucia  De Macedo Rodrigues M.D.   On: 11/23/2019 13:01   CT SHOULDER RIGHT WO CONTRAST  Result Date: 11/23/2019 CLINICAL DATA:  Severe shoulder pain following seizure EXAM: CT OF THE UPPER RIGHT EXTREMITY WITHOUT CONTRAST TECHNIQUE: Multidetector CT imaging of the upper right extremity was performed according to the standard protocol. COMPARISON:  X-ray 11/22/2019 FINDINGS: Bones/Joint/Cartilage Posterior dislocation of the humeral head relative to the glenoid. The anteromedial aspect of the humeral head remains perched upon the posterior glenoid rim with a large reverse Hill-Sachs impaction fracture. Large comminuted fracture fragment is displaced anteroinferiorly into the axillary pouch measuring 3.2 x 1.6 x 2.7 cm. Probable nondisplaced impaction fracture of the posterior glenoid rim (series 3, image 42). Large complex glenohumeral joint effusion with a small amount of fat density in the non dependent portion compatible with a lipohemarthrosis. AC joint is intact with mild degenerative changes. Ligaments Suboptimally assessed by CT. Muscles and Tendons Rotator cuff tendons are poorly evaluated although a injury to the anterior aspect of the supraspinatus tendon is suspected (series 9, image 55). Preserved rotator cuff muscle bulk. Soft tissues Extensive induration within the adjacent soft tissues without a well-defined fluid collection or hematoma. No axillary lymphadenopathy. Visualized portion of the right lung is clear. IMPRESSION: 1. Posterior dislocation of the humeral head relative to the glenoid with a large reverse Hill-Sachs impaction fracture. Large comminuted fracture fragment is displaced anteroinferiorly into the axillary pouch measuring 3.2 x 1.6 x 2.7 cm. 2. Probable nondisplaced impaction fracture of the posterior glenoid rim. 3. Rotator cuff tendons are poorly  evaluated although an injury to the anterior aspect of the distal supraspinatus tendon is suspected. 4. Large glenohumeral joint lipohemarthrosis. Electronically Signed   By: Nicholas  Plundo D.O.   On: 11/23/2019 10:33   DG CHEST PORT 1 VIEW  Result Date: 11/22/2019 CLINICAL DATA:  Unresponsive EXAM: PORTABLE   CHEST 1 VIEW COMPARISON:  08/14/2015 FINDINGS: No focal airspace disease or effusion. Stable cardiomediastinal silhouette with aortic atherosclerosis. No pneumothorax. No radiopaque foreign body visualized. IMPRESSION: No active disease. Electronically Signed   By: Kim  Fujinaga M.D.   On: 11/22/2019 17:40    Review of Systems  HENT: Negative for ear discharge, ear pain, hearing loss and tinnitus.   Eyes: Negative for photophobia and pain.  Respiratory: Negative for cough and shortness of breath.   Cardiovascular: Negative for chest pain.  Gastrointestinal: Negative for abdominal pain, nausea and vomiting.  Genitourinary: Negative for dysuria, flank pain, frequency and urgency.  Musculoskeletal: Positive for arthralgias (Right shoulder). Negative for back pain, myalgias and neck pain.  Neurological: Negative for dizziness and headaches.  Hematological: Does not bruise/bleed easily.  Psychiatric/Behavioral: The patient is not nervous/anxious.    Blood pressure (!) 140/92, pulse 79, temperature 97.9 F (36.6 C), temperature source Oral, resp. rate 17, height 5' 8" (1.727 m), weight 87 kg, SpO2 96 %. Physical Exam  Constitutional: He appears well-developed and well-nourished. No distress.  HENT:  Head: Normocephalic and atraumatic.  Eyes: Conjunctivae are normal. Right eye exhibits no discharge. Left eye exhibits no discharge. No scleral icterus.  Cardiovascular: Normal rate and regular rhythm.  Respiratory: Effort normal. No respiratory distress.  Musculoskeletal:     Cervical back: Normal range of motion.     Comments: Right shoulder, elbow, wrist, digits- no skin wounds, mod  diffuse TTP, no instability, unwilling or unable to allow external rotation of shoulder  Sens  Ax/R/M/U intact  Mot   Ax/ R/ PIN/ M/ AIN/ U intact  Rad 2+  Neurological: He is alert.  Skin: Skin is warm and dry. He is not diaphoretic.  Psychiatric: He has a normal mood and affect. His behavior is normal.    Assessment/Plan: Right humeral head fx -- Plan hemiarthroplasty vs reverse tomorrow afternoon with Dr. Varkey. Please keep NPO after MN. I spoke with attending and neurology who both have cleared for surgery. Seizure disorder -- per primary service.    Zyona Pettaway J. Elani Delph, PA-C Orthopedic Surgery [phone] 11/23/2019, 3:04 PM  

## 2019-11-23 NOTE — Progress Notes (Signed)
MRI attempted on pt. Pt moving too much with no ability to redirect to obtain diagnostic images.

## 2019-11-23 NOTE — Consult Note (Signed)
Reason for Consult:Right shoulder fx Referring Physician: A Swayze  Joe Bell is an 59 y.o. male.  HPI: [name] was admitted yesterday with his first tonic-clonic seizure. Workup is still pending as to cause. Though he was denying pain yesterday when he arrived he began to c/o right shoulder pain later in the day. X-rays showed a possible fx and CT scan confirmed and orthopedic surgery was consulted. Pt does not speak English and visit was conducted through personal interpreter.  History reviewed. No pertinent past medical history.  History reviewed. No pertinent surgical history.  History reviewed. No pertinent family history.  Social History:  reports that he has never smoked. He has never used smokeless tobacco. He reports that he does not drink alcohol or use drugs.  Allergies: No Known Allergies  Medications: I have reviewed the patient's current medications.  Results for orders placed or performed during the hospital encounter of 11/22/19 (from the past 48 hour(s))  CBG monitoring, ED     Status: Abnormal   Collection Time: 11/22/19 12:56 AM  Result Value Ref Range   Glucose-Capillary 135 (H) 70 - 99 mg/dL  CBC with Differential     Status: Abnormal   Collection Time: 11/22/19  1:09 AM  Result Value Ref Range   WBC 15.5 (H) 4.0 - 10.5 K/uL   RBC 4.70 4.22 - 5.81 MIL/uL   Hemoglobin 12.8 (L) 13.0 - 17.0 g/dL   HCT 86.7 67.2 - 09.4 %   MCV 87.2 80.0 - 100.0 fL   MCH 27.2 26.0 - 34.0 pg   MCHC 31.2 30.0 - 36.0 g/dL   RDW 70.9 62.8 - 36.6 %   Platelets 281 150 - 400 K/uL   nRBC 0.0 0.0 - 0.2 %   Neutrophils Relative % 57 %   Neutro Abs 8.8 (H) 1.7 - 7.7 K/uL   Lymphocytes Relative 36 %   Lymphs Abs 5.6 (H) 0.7 - 4.0 K/uL   Monocytes Relative 6 %   Monocytes Absolute 0.9 0.1 - 1.0 K/uL   Eosinophils Relative 1 %   Eosinophils Absolute 0.1 0.0 - 0.5 K/uL   Basophils Relative 0 %   Basophils Absolute 0.0 0.0 - 0.1 K/uL   Immature Granulocytes 0 %   Abs Immature Granulocytes  0.06 0.00 - 0.07 K/uL    Comment: Performed at [facility], [address] [place], [place] [address]  Comprehensive metabolic panel     Status: Abnormal   Collection Time: 11/22/19  1:09 AM  Result Value Ref Range   Sodium 139 135 - 145 mmol/L   Potassium 4.4 3.5 - 5.1 mmol/L   Chloride 103 98 - 111 mmol/L   CO2 17 (L) 22 - 32 mmol/L   Glucose, Bld 190 (H) 70 - 99 mg/dL   BUN 13 8 - 23 mg/dL   Creatinine, Ser 5.46 0.61 - 1.24 mg/dL   Calcium 8.4 (L) 8.9 - 10.3 mg/dL   Total Protein 7.8 6.5 - 8.1 g/dL   Albumin 3.8 3.5 - 5.0 g/dL   AST 44 (H) 15 - 41 U/L   ALT 19 0 - 44 U/L   Alkaline Phosphatase 54 38 - 126 U/L   Total Bilirubin 0.9 0.3 - 1.2 mg/dL   GFR calc non Af Amer >60 >60 mL/min   GFR calc Af Amer >60 >60 mL/min   Anion gap 19 (H) 5 - 15    Comment: Performed at [facility], [address] [place], [place] [address]  Ethanol     Status: None   Collection Time: 11/22/19  1:09 AM  Result Value Ref Range   Alcohol, Ethyl (B) <10 <10 mg/dL    Comment: (NOTE) Lowest detectable limit for serum alcohol is 10 mg/dL. For medical purposes only. Performed at [facility], [address] [place], [place] [address]   SARS CORONAVIRUS 2 (TAT 6-24 HRS) Nasopharyngeal Nasopharyngeal Swab     Status: None   Collection Time: 11/22/19  2:52 AM   Specimen: Nasopharyngeal Swab  Result Value Ref Range   SARS Coronavirus 2 NEGATIVE NEGATIVE    Comment: (NOTE) SARS-CoV-2 target nucleic acids are NOT DETECTED. The SARS-CoV-2 RNA is generally detectable in upper and lower respiratory specimens during the acute phase of infection. Negative results do not preclude SARS-CoV-2 infection, do not rule out co-infections with other pathogens, and should not be used as the sole basis for treatment or other patient management decisions. Negative results must be combined with clinical observations, patient history, and epidemiological information. The expected result is  Negative. Fact Sheet for Patients: HairSlick.no Fact Sheet for Healthcare Providers: quierodirigir.com This test is not yet approved or cleared by the [place] FDA and  has been authorized for detection and/or diagnosis of SARS-CoV-2 by FDA under an Emergency Use Authorization (EUA). This EUA will remain  in effect (meaning this test can be used) for the duration of the COVID-19 declaration under Section 56 4(b)(1) of the Act, 21 U.S.C. section 360bbb-3(b)(1), unless the authorization is terminated or revoked sooner. Performed at [facility], [address], [place], [place] [address]   Urinalysis, Routine w reflex microscopic     Status: Abnormal   Collection Time: 11/22/19  4:21 AM  Result Value Ref Range   Color, Urine YELLOW YELLOW   APPearance HAZY (A) CLEAR   Specific Gravity, Urine 1.019 1.005 - 1.030   pH 5.0 5.0 - 8.0   Glucose, UA 50 (A) NEGATIVE mg/dL   Hgb urine dipstick SMALL (A) NEGATIVE   Bilirubin Urine NEGATIVE NEGATIVE   Ketones, ur NEGATIVE NEGATIVE mg/dL   Protein, ur NEGATIVE NEGATIVE mg/dL   Nitrite NEGATIVE NEGATIVE   Leukocytes,Ua NEGATIVE NEGATIVE   RBC / HPF 0-5 0 - 5 RBC/hpf   WBC, UA 0-5 0 - 5 WBC/hpf   Bacteria, UA RARE (A) NONE SEEN   Squamous Epithelial / LPF 0-5 0 - 5   Mucus PRESENT    Hyaline Casts, UA PRESENT    Sperm, UA PRESENT     Comment: Performed at [facility], [address], [place], [place] [address]  Rapid urine drug screen (hospital performed)     Status: Abnormal   Collection Time: 11/22/19  4:21 AM  Result Value Ref Range   Opiates NONE DETECTED NONE DETECTED   Cocaine NONE DETECTED NONE DETECTED   Benzodiazepines POSITIVE (A) NONE DETECTED   Amphetamines NONE DETECTED NONE DETECTED   Tetrahydrocannabinol NONE DETECTED NONE DETECTED   Barbiturates NONE DETECTED NONE DETECTED    Comment: (NOTE) DRUG SCREEN FOR MEDICAL PURPOSES ONLY.  IF CONFIRMATION  IS NEEDED FOR ANY PURPOSE, NOTIFY LAB WITHIN 5 DAYS. LOWEST DETECTABLE LIMITS FOR URINE DRUG SCREEN Drug Class                     Cutoff (ng/mL) Amphetamine and metabolites    1000 Barbiturate and metabolites    200 Benzodiazepine  200 Tricyclics and metabolites     300 Opiates and metabolites        300 Cocaine and metabolites        300 THC                            50 Performed at [facility], [address], [place], [place] [address]   Glucose, capillary     Status: Abnormal   Collection Time: 11/22/19  5:32 AM  Result Value Ref Range   Glucose-Capillary 104 (H) 70 - 99 mg/dL  Glucose, capillary     Status: None   Collection Time: 11/22/19  9:59 AM  Result Value Ref Range   Glucose-Capillary 88 70 - 99 mg/dL  Glucose, capillary     Status: Abnormal   Collection Time: 11/22/19 11:26 AM  Result Value Ref Range   Glucose-Capillary 102 (H) 70 - 99 mg/dL  Glucose, capillary     Status: None   Collection Time: 11/22/19  3:38 PM  Result Value Ref Range   Glucose-Capillary 90 70 - 99 mg/dL  Glucose, capillary     Status: Abnormal   Collection Time: 11/22/19  8:15 PM  Result Value Ref Range   Glucose-Capillary 111 (H) 70 - 99 mg/dL  Glucose, capillary     Status: Abnormal   Collection Time: 11/23/19 12:38 AM  Result Value Ref Range   Glucose-Capillary 121 (H) 70 - 99 mg/dL  HIV Antibody (routine testing w rflx)     Status: None   Collection Time: 11/23/19  2:13 AM  Result Value Ref Range   HIV Screen 4th Generation wRfx NON REACTIVE NON REACTIVE    Comment: Performed at [facility], [address] [place], [place] [address]  Hemoglobin A1c     Status: None   Collection Time: 11/23/19  2:13 AM  Result Value Ref Range   Hgb A1c MFr Bld 5.0 4.8 - 5.6 %    Comment: (NOTE) Pre diabetes:          5.7%-6.4% Diabetes:              >6.4% Glycemic control for   <7.0% adults with diabetes    Mean Plasma Glucose 96.8 mg/dL    Comment: Performed  at [facility], [address] [place], [place] [address]  CBC WITH DIFFERENTIAL     Status: Abnormal   Collection Time: 11/23/19  2:13 AM  Result Value Ref Range   WBC 9.2 4.0 - 10.5 K/uL   RBC 4.54 4.22 - 5.81 MIL/uL   Hemoglobin 12.2 (L) 13.0 - 17.0 g/dL   HCT 91.4 (L) 78.2 - 95.6 %   MCV 83.9 80.0 - 100.0 fL   MCH 26.9 26.0 - 34.0 pg   MCHC 32.0 30.0 - 36.0 g/dL   RDW 21.3 08.6 - 57.8 %   Platelets 229 150 - 400 K/uL   nRBC 0.0 0.0 - 0.2 %   Neutrophils Relative % 64 %   Neutro Abs 5.9 1.7 - 7.7 K/uL   Lymphocytes Relative 28 %   Lymphs Abs 2.5 0.7 - 4.0 K/uL   Monocytes Relative 8 %   Monocytes Absolute 0.7 0.1 - 1.0 K/uL   Eosinophils Relative 0 %   Eosinophils Absolute 0.0 0.0 - 0.5 K/uL   Basophils Relative 0 %   Basophils Absolute 0.0 0.0 - 0.1 K/uL   Immature Granulocytes 0 %   Abs  Immature Granulocytes 0.03 0.00 - 0.07 K/uL    Comment: Performed at [facility], [place] [address], [place], [place] [address]  Comprehensive metabolic panel     Status: Abnormal   Collection Time: 11/23/19  2:13 AM  Result Value Ref Range   Sodium 138 135 - 145 mmol/L   Potassium 3.7 3.5 - 5.1 mmol/L   Chloride 102 98 - 111 mmol/L   CO2 25 22 - 32 mmol/L   Glucose, Bld 119 (H) 70 - 99 mg/dL   BUN 11 8 - 23 mg/dL   Creatinine, Ser 0.98 0.61 - 1.24 mg/dL   Calcium 8.3 (L) 8.9 - 10.3 mg/dL   Total Protein 7.5 6.5 - 8.1 g/dL   Albumin 3.7 3.5 - 5.0 g/dL   AST 57 (H) 15 - 41 U/L   ALT 25 0 - 44 U/L   Alkaline Phosphatase 49 38 - 126 U/L   Total Bilirubin 1.2 0.3 - 1.2 mg/dL   GFR calc non Af Amer >60 >60 mL/min   GFR calc Af Amer >60 >60 mL/min   Anion gap 11 5 - 15    Comment: Performed at [facility] [address], [place], [place] [address]  Glucose, capillary     Status: Abnormal   Collection Time: 11/23/19  3:49 AM  Result Value Ref Range   Glucose-Capillary 118 (H) 70 - 99 mg/dL  Glucose, capillary     Status: Abnormal   Collection Time: 11/23/19  8:28  AM  Result Value Ref Range   Glucose-Capillary 107 (H) 70 - 99 mg/dL   Comment 1 Notify RN    Comment 2 Document in Chart     EEG  Result Date: 11/23/2019 Lora Havens, MD     11/23/2019  2:03 PM Patient Name: Joe Bell MRN: [id number] Epilepsy Attending: Lora Havens Referring Physician/Provider: Dr. Mitzi Hansen Date: 11/22/2019 Duration: 26.02 minutes Patient history: 59 year old male presented with new onset seizure.  EEG to evaluate for epilepsy. Level of alertness: Awake, sleep AEDs during EEG study: Keppra, Ativan Technical aspects: This EEG study was done with scalp electrodes positioned according to the 10-20 International system of electrode placement. Electrical activity was acquired at a sampling rate of 500Hz  and reviewed with a high frequency filter of 70Hz  and a low frequency filter of 1Hz . EEG data were recorded continuously and digitally stored. Description: During awake state, no clear posterior dominant was seen.  Sleep was characterized by sleep spindles (12 to 14 Hz), maximal frontocentral as well as slow-wave sleep.  EEG also showed continuous generalized 3-5 theta-delta slowing admixed with an excessive amount of 15 to 18 Hz, 2-3 uV beta activity with irregular morphology distributed symmetrically and diffusely.  Hyperventilation and photic stimulation were not performed. Abnormality -Continuous slow, generalized -Excessive beta, generalized IMPRESSION: This study is suggestive of mild to moderate diffuse encephalopathy, nonspecific to etiology. The excessive beta activity seen in the background is most likely due to the effect of benzodiazepine and is a benign EEG pattern. No seizures or epileptiform discharges were seen throughout the recording. Lora Havens   DG Shoulder Right  Result Date: 11/22/2019 CLINICAL DATA:  Shoulder pain EXAM: RIGHT SHOULDER - 2+ VIEW COMPARISON:  None. FINDINGS: AC joint appears intact. No humeral head dislocation. Osseous deformity along  the inferior aspect of the humerus IMPRESSION: Osseous deformity along the inferior aspect of the right humeral head and neck, may relate to fracture deformity of uncertain age. CT could be  obtained for further evaluation. Electronically Signed   By: Jasmine Pang M.D.   On: 11/22/2019 17:39   DG Abd 1 View  Result Date: 11/22/2019 CLINICAL DATA:  Unresponsive EXAM: ABDOMEN - 1 VIEW COMPARISON:  None. FINDINGS: Non inclusion of the left upper quadrant. Nonobstructed gas pattern with moderate stool in the colon. No radiopaque foreign body. Probable phleboliths in the left pelvis. IMPRESSION: Negative, allowing for incomplete inclusion of the left upper quadrant of the abdomen Electronically Signed   By: Jasmine Pang M.D.   On: 11/22/2019 17:41   CT Head Wo Contrast  Result Date: 11/22/2019 CLINICAL DATA:  Nontraumatic seizure EXAM: CT HEAD WITHOUT CONTRAST TECHNIQUE: Contiguous axial images were obtained from the base of the skull through the vertex without intravenous contrast. COMPARISON:  None. FINDINGS: Brain: No evidence of acute infarction, hemorrhage, hydrocephalus, extra-axial collection or mass lesion/mass effect. Normal cortical finding to explain seizure. Vascular: No hyperdense vessel or unexpected calcification. Skull: Negative Sinuses/Orbits: Negative IMPRESSION: Negative head CT. Electronically Signed   By: Marnee Spring M.D.   On: 11/22/2019 04:47   MR BRAIN WO CONTRAST  Result Date: 11/23/2019 CLINICAL DATA:  Altered mental status. Seizures. EXAM: MRI HEAD WITHOUT CONTRAST TECHNIQUE: Multiplanar, multiecho pulse sequences of the brain and surrounding structures were obtained without intravenous contrast. COMPARISON:  None. FINDINGS: Images are severely degraded by motion. Brain: No acute infarction, hemorrhage, hydrocephalus, extra-axial collection or large mass lesion. Vascular: Normal flow voids. Skull and upper cervical spine: Normal marrow signal. Sinuses/Orbits: Negative. Other:  None. IMPRESSION: 1. Severely motion degraded study. 2. No acute intracranial abnormality identified. Electronically Signed   By: Baldemar Lenis M.D.   On: 11/23/2019 13:01   CT SHOULDER RIGHT WO CONTRAST  Result Date: 11/23/2019 CLINICAL DATA:  Severe shoulder pain following seizure EXAM: CT OF THE UPPER RIGHT EXTREMITY WITHOUT CONTRAST TECHNIQUE: Multidetector CT imaging of the upper right extremity was performed according to the standard protocol. COMPARISON:  X-ray 11/22/2019 FINDINGS: Bones/Joint/Cartilage Posterior dislocation of the humeral head relative to the glenoid. The anteromedial aspect of the humeral head remains perched upon the posterior glenoid rim with a large reverse Hill-Sachs impaction fracture. Large comminuted fracture fragment is displaced anteroinferiorly into the axillary pouch measuring 3.2 x 1.6 x 2.7 cm. Probable nondisplaced impaction fracture of the posterior glenoid rim (series 3, image 42). Large complex glenohumeral joint effusion with a small amount of fat density in the non dependent portion compatible with a lipohemarthrosis. AC joint is intact with mild degenerative changes. Ligaments Suboptimally assessed by CT. Muscles and Tendons Rotator cuff tendons are poorly evaluated although a injury to the anterior aspect of the supraspinatus tendon is suspected (series 9, image 55). Preserved rotator cuff muscle bulk. Soft tissues Extensive induration within the adjacent soft tissues without a well-defined fluid collection or hematoma. No axillary lymphadenopathy. Visualized portion of the right lung is clear. IMPRESSION: 1. Posterior dislocation of the humeral head relative to the glenoid with a large reverse Hill-Sachs impaction fracture. Large comminuted fracture fragment is displaced anteroinferiorly into the axillary pouch measuring 3.2 x 1.6 x 2.7 cm. 2. Probable nondisplaced impaction fracture of the posterior glenoid rim. 3. Rotator cuff tendons are poorly  evaluated although an injury to the anterior aspect of the distal supraspinatus tendon is suspected. 4. Large glenohumeral joint lipohemarthrosis. Electronically Signed   By: Duanne Guess D.O.   On: 11/23/2019 10:33   DG CHEST PORT 1 VIEW  Result Date: 11/22/2019 CLINICAL DATA:  Unresponsive EXAM: PORTABLE  CHEST 1 VIEW COMPARISON:  08/14/2015 FINDINGS: No focal airspace disease or effusion. Stable cardiomediastinal silhouette with aortic atherosclerosis. No pneumothorax. No radiopaque foreign body visualized. IMPRESSION: No active disease. Electronically Signed   By: Jasmine PangKim  Fujinaga M.D.   On: 11/22/2019 17:40    Review of Systems  HENT: Negative for ear discharge, ear pain, hearing loss and tinnitus.   Eyes: Negative for photophobia and pain.  Respiratory: Negative for cough and shortness of breath.   Cardiovascular: Negative for chest pain.  Gastrointestinal: Negative for abdominal pain, nausea and vomiting.  Genitourinary: Negative for dysuria, flank pain, frequency and urgency.  Musculoskeletal: Positive for arthralgias (Right shoulder). Negative for back pain, myalgias and neck pain.  Neurological: Negative for dizziness and headaches.  Hematological: Does not bruise/bleed easily.  Psychiatric/Behavioral: The patient is not nervous/anxious.    Blood pressure (!) 140/92, pulse 79, temperature 97.9 F (36.6 C), temperature source Oral, resp. rate 17, height 5\' 8"  (1.727 m), weight 87 kg, SpO2 96 %. Physical Exam  Constitutional: He appears well-developed and well-nourished. No distress.  HENT:  Head: Normocephalic and atraumatic.  Eyes: Conjunctivae are normal. Right eye exhibits no discharge. Left eye exhibits no discharge. No scleral icterus.  Cardiovascular: Normal rate and regular rhythm.  Respiratory: Effort normal. No respiratory distress.  Musculoskeletal:     Cervical back: Normal range of motion.     Comments: Right shoulder, elbow, wrist, digits- no skin wounds, mod  diffuse TTP, no instability, unwilling or unable to allow external rotation of shoulder  Sens  Ax/R/M/U intact  Mot   Ax/ R/ PIN/ M/ AIN/ U intact  Rad 2+  Neurological: He is alert.  Skin: Skin is warm and dry. He is not diaphoretic.  Psychiatric: He has a normal mood and affect. His behavior is normal.    Assessment/Plan: Right humeral head fx -- Plan hemiarthroplasty vs reverse tomorrow afternoon with Dr. Everardo PacificVarkey. Please keep NPO after MN. I spoke with attending and neurology who both have cleared for surgery. Seizure disorder -- per primary service.    Freeman CaldronMichael J. Nylia Gavina, PA-C Orthopedic Surgery [phone] 11/23/2019, 3:04 PM

## 2019-11-23 NOTE — Plan of Care (Signed)

## 2019-11-23 NOTE — Progress Notes (Signed)
Used stratus interpreter [place] ID # [id number] for patient about MRI and medication administrations

## 2019-11-23 NOTE — Procedures (Signed)
Patient Name: Joe Bell  MRN: [id number]  Epilepsy Attending: Charlsie Quest  Referring Physician/Provider: Dr. Odie Sera Date: 11/22/2019 Duration: 26.02 minutes  Patient history: 59 year old male presented with new onset seizure.  EEG to evaluate for epilepsy.  Level of alertness: Awake, sleep  AEDs during EEG study: Keppra, Ativan  Technical aspects: This EEG study was done with scalp electrodes positioned according to the 10-20 International system of electrode placement. Electrical activity was acquired at a sampling rate of 500Hz  and reviewed with a high frequency filter of 70Hz  and a low frequency filter of 1Hz . EEG data were recorded continuously and digitally stored.   Description: During awake state, no clear posterior dominant was seen.  Sleep was characterized by sleep spindles (12 to 14 Hz), maximal frontocentral as well as slow-wave sleep.  EEG also showed continuous generalized 3-5 theta-delta slowing admixed with an excessive amount of 15 to 18 Hz, 2-3 uV beta activity with irregular morphology distributed symmetrically and diffusely.  Hyperventilation and photic stimulation were not performed.  Abnormality -Continuous slow, generalized -Excessive beta, generalized   IMPRESSION: This study is suggestive of mild to moderate diffuse encephalopathy, nonspecific to etiology. The excessive beta activity seen in the background is most likely due to the effect of benzodiazepine and is a benign EEG pattern. No seizures or epileptiform discharges were seen throughout the recording.  Laveda Demedeiros 

## 2019-11-23 NOTE — Progress Notes (Signed)
NEUROLOGY PROGRESS NOTE  Subjective: Patient continues to complain of right shoulder pain.  No seizure activity overnight  Exam: Vitals:   11/22/19 2340 11/23/19 0349  BP: (!) 153/95 (!) 152/93  Pulse: 84 85  Resp: 19 18  Temp: 98.8 F (37.1 C) 100 F (37.8 C)  SpO2: 98% 100%    Physical Exam  Constitutional: Appears well-developed and well-nourished.  Psych: Affect appropriate to situation Eyes: No scleral injection HENT: No OP obstrucion Head: Normocephalic.  Cardiovascular: Normal rate and regular rhythm.  Respiratory: Effort normal, non-labored breathing GI: Soft.  No distension. There is no tenderness.  Skin: WDI   Neuro:  Mental Status: Alert, able to mimic commands, continues to state that his right shoulder is painful Cranial Nerves: II:  Visual fields grossly normal,  III,IV, VI: ptosis not present, extra-ocular motions intact bilaterally pupils equal, round, reactive to light and accommodation V,VII: smile symmetric, facial light touch sensation normal bilaterally VIII: hearing normal bilaterally Motor: Right : Upper extremity   3/5    Left:     Upper extremity   5/5  Lower extremity   5/5     Lower extremity   5/5 Tone and bulk:normal tone throughout; no atrophy noted Sensory: Pinprick and light touch intact throughout, bilaterally   Medications:  Scheduled: . insulin aspart  0-9 Units Subcutaneous Q4H  . levETIRAcetam  500 mg Oral BID  . sodium chloride flush  3 mL Intravenous Q12H   Continuous: . dextrose 50 mL/hr at 11/22/19 1147    Pertinent Labs/Diagnostics:   DG Shoulder Right  Result Date: 11/22/2019 CLINICAL DATA:  Shoulder pain EXAM: RIGHT SHOULDER - 2+ VIEW COMPARISON:  None. FINDINGS: AC joint appears intact. No humeral head dislocation. Osseous deformity along the inferior aspect of the humerus IMPRESSION: Osseous deformity along the inferior aspect of the right humeral head and neck, may relate to fracture deformity of uncertain age.  CT could be obtained for further evaluation. Electronically Signed   By: Jasmine Pang M.D.   On: 11/22/2019 17:39    CT Head Wo Contrast  Result Date: 11/22/2019 CLINICAL DATA:  Nontraumatic seizure EXAM: CT HEAD WITHOUT CONTRAST TECHNIQUE: Contiguous axial images were obtained from the base of the skull through the vertex without intravenous contrast. COMPARISON:  None. FINDINGS: Brain: No evidence of acute infarction, hemorrhage, hydrocephalus, extra-axial collection or mass lesion/mass effect. Normal cortical finding to explain seizure. Vascular: No hyperdense vessel or unexpected calcification. Skull: Negative Sinuses/Orbits: Negative IMPRESSION: Negative head CT. Electronically Signed   By: Marnee Spring M.D.   On: 11/22/2019 04:47    MRI brain attempted however patient was unable to stay still     Impression: This is a 59 year old male with findings consistent of initial postictal state secondary to new onset seizure.  Currently patient is awake and alert.  He is able to understand simple questions at this time.  His only complaint is right shoulder pain.  No further seizures overnight.  Patient was unable to obtain MRI secondary to inability to stay still.  Attempt to have MRI today with sedation.  Recommendations: -Second attempt for MRI -EEG -Continue Keppra at this time and may DC if MRI and EEG showed no provoking evidence for seizure. -Neurology will continue to follow EEG and MRI of brain    11/23/2019, 9:46 AM Felicie Morn PA-C [facility] [phone]   I have seen the patient and reviewed the above note.   EEG and MRI are negative. No need for keppra at this  time. No driving for 6 months and outpatient follow up with neurology.   Roland Rack, MD [facility] [phone]  If 7pm- 7am, please page neurology on call as listed in [place].

## 2019-11-24 ENCOUNTER — Encounter (HOSPITAL_COMMUNITY)
Admission: EM | Disposition: A | Discharge status: Home / Self Care | Payer: Self-pay | Source: Home / Self Care | Attending: Internal Medicine

## 2019-11-24 ENCOUNTER — Inpatient Hospital Stay (HOSPITAL_COMMUNITY): Payer: Self-pay

## 2019-11-24 ENCOUNTER — Encounter (HOSPITAL_COMMUNITY): Payer: Self-pay | Admitting: Internal Medicine

## 2019-11-24 ENCOUNTER — Inpatient Hospital Stay (HOSPITAL_COMMUNITY): Payer: Self-pay | Admitting: Anesthesiology

## 2019-11-24 DIAGNOSIS — S4291XA Fracture of right shoulder girdle, part unspecified, initial encounter for closed fracture: Secondary | ICD-10-CM | POA: Diagnosis present

## 2019-11-24 HISTORY — PX: SHOULDER HEMI-ARTHROPLASTY: SHX5049

## 2019-11-24 HISTORY — PX: REVERSE SHOULDER ARTHROPLASTY: SHX5054

## 2019-11-24 LAB — GLUCOSE, CAPILLARY
Glucose-Capillary: 104 mg/dL — ABNORMAL HIGH (ref 70–99)
Glucose-Capillary: 107 mg/dL — ABNORMAL HIGH (ref 70–99)
Glucose-Capillary: 113 mg/dL — ABNORMAL HIGH (ref 70–99)
Glucose-Capillary: 114 mg/dL — ABNORMAL HIGH (ref 70–99)
Glucose-Capillary: 149 mg/dL — ABNORMAL HIGH (ref 70–99)
Glucose-Capillary: 151 mg/dL — ABNORMAL HIGH (ref 70–99)
Glucose-Capillary: 154 mg/dL — ABNORMAL HIGH (ref 70–99)

## 2019-11-24 LAB — SURGICAL PCR SCREEN
MRSA, PCR: NEGATIVE
Staphylococcus aureus: NEGATIVE

## 2019-11-24 SURGERY — HEMIARTHROPLASTY, SHOULDER
Anesthesia: General | Site: Shoulder | Laterality: Right

## 2019-11-24 MED ORDER — DEXAMETHASONE SODIUM PHOSPHATE 10 MG/ML IJ SOLN
INTRAMUSCULAR | Status: AC
  Filled 2019-11-24: qty 1

## 2019-11-24 MED ORDER — METHOCARBAMOL 1000 MG/10ML IJ SOLN
500.0000 mg | Freq: Four times a day (QID) | INTRAVENOUS | Status: DC | PRN
  Filled 2019-11-24: qty 5

## 2019-11-24 MED ORDER — ENSURE PRE-SURGERY PO LIQD
296.0000 mL | Freq: Once | ORAL | Status: DC
  Filled 2019-11-24: qty 296

## 2019-11-24 MED ORDER — PROPOFOL 10 MG/ML IV BOLUS
INTRAVENOUS | Status: DC | PRN
  Administered 2019-11-24: 120 mg via INTRAVENOUS

## 2019-11-24 MED ORDER — CELECOXIB 200 MG PO CAPS
200.0000 mg | ORAL_CAPSULE | Freq: Two times a day (BID) | ORAL | Status: DC
  Administered 2019-11-24 – 2019-11-26 (×4): 200 mg via ORAL
  Filled 2019-11-24 (×4): qty 1

## 2019-11-24 MED ORDER — PHENYLEPHRINE 40 MCG/ML (10ML) SYRINGE FOR IV PUSH (FOR BLOOD PRESSURE SUPPORT)
PREFILLED_SYRINGE | INTRAVENOUS | Status: DC | PRN
  Administered 2019-11-24 (×2): 80 ug via INTRAVENOUS

## 2019-11-24 MED ORDER — PROMETHAZINE HCL 25 MG/ML IJ SOLN: 6.2500 mg | INTRAMUSCULAR | Status: DC | PRN

## 2019-11-24 MED ORDER — DIPHENHYDRAMINE HCL 12.5 MG/5ML PO ELIX
12.5000 mg | ORAL_SOLUTION | ORAL | Status: DC | PRN
  Administered 2019-11-25: 25 mg via ORAL
  Filled 2019-11-24: qty 10

## 2019-11-24 MED ORDER — CEFAZOLIN SODIUM-DEXTROSE 1-4 GM/50ML-% IV SOLN
1.0000 g | Freq: Four times a day (QID) | INTRAVENOUS | Status: AC
  Administered 2019-11-24 – 2019-11-25 (×3): 1 g via INTRAVENOUS
  Filled 2019-11-24 (×4): qty 50

## 2019-11-24 MED ORDER — SUGAMMADEX SODIUM 200 MG/2ML IV SOLN
INTRAVENOUS | Status: DC | PRN
  Administered 2019-11-24: 300 mg via INTRAVENOUS

## 2019-11-24 MED ORDER — ONDANSETRON HCL 4 MG/2ML IJ SOLN
INTRAMUSCULAR | Status: DC | PRN
  Administered 2019-11-24: 4 mg via INTRAVENOUS

## 2019-11-24 MED ORDER — ROCURONIUM BROMIDE 10 MG/ML (PF) SYRINGE
PREFILLED_SYRINGE | INTRAVENOUS | Status: DC | PRN
  Administered 2019-11-24: 90 mg via INTRAVENOUS
  Administered 2019-11-24: 10 mg via INTRAVENOUS

## 2019-11-24 MED ORDER — PHENYLEPHRINE 40 MCG/ML (10ML) SYRINGE FOR IV PUSH (FOR BLOOD PRESSURE SUPPORT)
PREFILLED_SYRINGE | INTRAVENOUS | Status: AC
  Filled 2019-11-24: qty 10

## 2019-11-24 MED ORDER — BISACODYL 5 MG PO TBEC: 5.0000 mg | DELAYED_RELEASE_TABLET | Freq: Every day | ORAL | Status: DC | PRN

## 2019-11-24 MED ORDER — LACTATED RINGERS IV SOLN: INTRAVENOUS | Status: DC | PRN

## 2019-11-24 MED ORDER — MEPERIDINE HCL 25 MG/ML IJ SOLN: 6.2500 mg | INTRAMUSCULAR | Status: DC | PRN

## 2019-11-24 MED ORDER — OXYCODONE HCL 5 MG PO TABS
5.0000 mg | ORAL_TABLET | ORAL | Status: DC | PRN
  Administered 2019-11-24: 10 mg via ORAL
  Administered 2019-11-25 (×2): 5 mg via ORAL
  Administered 2019-11-25: 22:00:00 10 mg via ORAL
  Filled 2019-11-24: qty 2
  Filled 2019-11-24 (×2): qty 1
  Filled 2019-11-24: qty 2

## 2019-11-24 MED ORDER — POVIDONE-IODINE 10 % EX SWAB: 2.0000 "application " | Freq: Once | CUTANEOUS | Status: DC

## 2019-11-24 MED ORDER — MAGNESIUM CITRATE PO SOLN: 1.0000 | Freq: Once | ORAL | Status: DC | PRN

## 2019-11-24 MED ORDER — MIDAZOLAM HCL 2 MG/2ML IJ SOLN
INTRAMUSCULAR | Status: AC
  Filled 2019-11-24: qty 2

## 2019-11-24 MED ORDER — POLYETHYLENE GLYCOL 3350 17 G PO PACK: 17.0000 g | PACK | Freq: Every day | ORAL | Status: DC | PRN

## 2019-11-24 MED ORDER — BUPIVACAINE LIPOSOME 1.3 % IJ SUSP
INTRAMUSCULAR | Status: DC | PRN
  Administered 2019-11-24: 10 mL via PERINEURAL

## 2019-11-24 MED ORDER — TRANEXAMIC ACID-NACL 1000-0.7 MG/100ML-% IV SOLN
INTRAVENOUS | Status: DC | PRN
  Administered 2019-11-24: 1000 mg via INTRAVENOUS

## 2019-11-24 MED ORDER — ONDANSETRON HCL 4 MG PO TABS: 4.0000 mg | ORAL_TABLET | Freq: Four times a day (QID) | ORAL | Status: DC | PRN

## 2019-11-24 MED ORDER — ONDANSETRON HCL 4 MG/2ML IJ SOLN
INTRAMUSCULAR | Status: AC
  Filled 2019-11-24: qty 2

## 2019-11-24 MED ORDER — FENTANYL CITRATE (PF) 100 MCG/2ML IJ SOLN
INTRAMUSCULAR | Status: DC | PRN
  Administered 2019-11-24: 50 ug via INTRAVENOUS

## 2019-11-24 MED ORDER — METHOCARBAMOL 500 MG PO TABS: 500.0000 mg | ORAL_TABLET | Freq: Four times a day (QID) | ORAL | Status: DC | PRN

## 2019-11-24 MED ORDER — VANCOMYCIN HCL 1000 MG IV SOLR
INTRAVENOUS | Status: AC
  Filled 2019-11-24: qty 1000

## 2019-11-24 MED ORDER — FENTANYL CITRATE (PF) 100 MCG/2ML IJ SOLN
INTRAMUSCULAR | Status: AC
  Administered 2019-11-24: 100 ug via INTRAVENOUS
  Filled 2019-11-24: qty 2

## 2019-11-24 MED ORDER — ACETAMINOPHEN 500 MG PO TABS
1000.0000 mg | ORAL_TABLET | Freq: Three times a day (TID) | ORAL | Status: AC
  Administered 2019-11-24 – 2019-11-25 (×4): 1000 mg via ORAL
  Filled 2019-11-24 (×4): qty 2

## 2019-11-24 MED ORDER — TRANEXAMIC ACID-NACL 1000-0.7 MG/100ML-% IV SOLN
INTRAVENOUS | Status: AC
  Filled 2019-11-24: qty 100

## 2019-11-24 MED ORDER — LIDOCAINE 2% (20 MG/ML) 5 ML SYRINGE
INTRAMUSCULAR | Status: AC
  Filled 2019-11-24: qty 5

## 2019-11-24 MED ORDER — FENTANYL CITRATE (PF) 100 MCG/2ML IJ SOLN: 100.0000 ug | Freq: Once | INTRAMUSCULAR | Status: AC

## 2019-11-24 MED ORDER — MIDAZOLAM HCL 2 MG/2ML IJ SOLN: 2.0000 mg | Freq: Once | INTRAMUSCULAR | Status: AC

## 2019-11-24 MED ORDER — METOCLOPRAMIDE HCL 10 MG PO TABS: 5.0000 mg | ORAL_TABLET | Freq: Three times a day (TID) | ORAL | Status: DC | PRN

## 2019-11-24 MED ORDER — EPHEDRINE SULFATE-NACL 50-0.9 MG/10ML-% IV SOSY
PREFILLED_SYRINGE | INTRAVENOUS | Status: DC | PRN
  Administered 2019-11-24 (×2): 5 mg via INTRAVENOUS

## 2019-11-24 MED ORDER — DEXAMETHASONE SODIUM PHOSPHATE 10 MG/ML IJ SOLN
INTRAMUSCULAR | Status: DC | PRN
  Administered 2019-11-24: 10 mg via INTRAVENOUS

## 2019-11-24 MED ORDER — ONDANSETRON HCL 4 MG/2ML IJ SOLN: 4.0000 mg | Freq: Four times a day (QID) | INTRAMUSCULAR | Status: DC | PRN

## 2019-11-24 MED ORDER — EPHEDRINE 5 MG/ML INJ
INTRAVENOUS | Status: AC
  Filled 2019-11-24: qty 10

## 2019-11-24 MED ORDER — SODIUM CHLORIDE 0.9 % IR SOLN
Status: DC | PRN
  Administered 2019-11-24: 3000 mL

## 2019-11-24 MED ORDER — HYDROMORPHONE HCL 1 MG/ML IJ SOLN: 0.2500 mg | INTRAMUSCULAR | Status: DC | PRN

## 2019-11-24 MED ORDER — 0.9 % SODIUM CHLORIDE (POUR BTL) OPTIME
TOPICAL | Status: DC | PRN
  Administered 2019-11-24: 1000 mL

## 2019-11-24 MED ORDER — DEXTROSE IN LACTATED RINGERS 5 % IV SOLN: INTRAVENOUS | Status: DC

## 2019-11-24 MED ORDER — HYDROMORPHONE HCL 1 MG/ML IJ SOLN: 0.5000 mg | INTRAMUSCULAR | Status: DC | PRN

## 2019-11-24 MED ORDER — OXYCODONE HCL 5 MG/5ML PO SOLN: 5.0000 mg | Freq: Once | ORAL | Status: DC | PRN

## 2019-11-24 MED ORDER — BUPIVACAINE HCL (PF) 0.5 % IJ SOLN
INTRAMUSCULAR | Status: DC | PRN
  Administered 2019-11-24: 20 mL via PERINEURAL

## 2019-11-24 MED ORDER — METOCLOPRAMIDE HCL 5 MG/ML IJ SOLN: 5.0000 mg | Freq: Three times a day (TID) | INTRAMUSCULAR | Status: DC | PRN

## 2019-11-24 MED ORDER — VANCOMYCIN HCL 1000 MG IV SOLR
INTRAVENOUS | Status: DC | PRN
  Administered 2019-11-24: 1000 mg via TOPICAL

## 2019-11-24 MED ORDER — CEFAZOLIN SODIUM-DEXTROSE 2-4 GM/100ML-% IV SOLN
2.0000 g | INTRAVENOUS | Status: AC
  Administered 2019-11-24: 2 g via INTRAVENOUS
  Filled 2019-11-24: qty 100

## 2019-11-24 MED ORDER — PHENYLEPHRINE HCL-NACL 10-0.9 MG/250ML-% IV SOLN
INTRAVENOUS | Status: DC | PRN
  Administered 2019-11-24: 50 ug/min via INTRAVENOUS

## 2019-11-24 MED ORDER — OXYCODONE HCL 5 MG PO TABS: 5.0000 mg | ORAL_TABLET | Freq: Once | ORAL | Status: DC | PRN

## 2019-11-24 MED ORDER — DOCUSATE SODIUM 100 MG PO CAPS
100.0000 mg | ORAL_CAPSULE | Freq: Two times a day (BID) | ORAL | Status: DC
  Administered 2019-11-24 – 2019-11-26 (×4): 100 mg via ORAL
  Filled 2019-11-24 (×4): qty 1

## 2019-11-24 MED ORDER — FENTANYL CITRATE (PF) 250 MCG/5ML IJ SOLN
INTRAMUSCULAR | Status: AC
  Filled 2019-11-24: qty 5

## 2019-11-24 MED ORDER — LIDOCAINE 2% (20 MG/ML) 5 ML SYRINGE
INTRAMUSCULAR | Status: DC | PRN
  Administered 2019-11-24: 60 mg via INTRAVENOUS

## 2019-11-24 MED ORDER — MIDAZOLAM HCL 2 MG/2ML IJ SOLN
INTRAMUSCULAR | Status: AC
  Administered 2019-11-24: 14:00:00 2 mg via INTRAVENOUS
  Filled 2019-11-24: qty 2

## 2019-11-24 MED ORDER — CHLORHEXIDINE GLUCONATE 4 % EX LIQD
60.0000 mL | Freq: Once | CUTANEOUS | Status: AC
  Administered 2019-11-24: 4 via TOPICAL
  Filled 2019-11-24: qty 60

## 2019-11-24 SURGICAL SUPPLY — 87 items
BASEPLATE GLENOID STD REV 42 (Joint) ×2 IMPLANT
BASEPLATE GLENOSPHERE 25 STD (Miscellaneous) ×1 IMPLANT
BASEPLATE GLENOSPHERE 25MM STD (Miscellaneous) ×1 IMPLANT
BENZOIN TINCTURE PRP APPL 2/3 (GAUZE/BANDAGES/DRESSINGS) ×1 IMPLANT
BIT DRILL 3.2 PERIPHERAL SCREW (BIT) ×2 IMPLANT
BLADE SAW SAG 29X58X.64 (BLADE) IMPLANT
BLADE SAW SAG 73X25 THK (BLADE) ×1
BLADE SAW SGTL 73X25 THK (BLADE) ×2 IMPLANT
CHLORAPREP W/TINT 26 (MISCELLANEOUS) ×6 IMPLANT
CLOSURE STERI-STRIP 1/2X4 (GAUZE/BANDAGES/DRESSINGS) ×1
CLOSURE WOUND 1/2 X4 (GAUZE/BANDAGES/DRESSINGS)
CLSR STERI-STRIP ANTIMIC 1/2X4 (GAUZE/BANDAGES/DRESSINGS) ×1 IMPLANT
COVER SURGICAL LIGHT HANDLE (MISCELLANEOUS) ×3 IMPLANT
COVER WAND RF STERILE (DRAPES) ×1 IMPLANT
DRAPE C-ARM 42X72 X-RAY (DRAPES) IMPLANT
DRAPE HALF SHEET 40X57 (DRAPES) ×9 IMPLANT
DRAPE INCISE IOBAN 66X45 STRL (DRAPES) ×4 IMPLANT
DRAPE ORTHO SPLIT 77X108 STRL (DRAPES) ×4
DRAPE SURG ORHT 6 SPLT 77X108 (DRAPES) ×2 IMPLANT
DRAPE SWITCH (DRAPES) ×3 IMPLANT
DRAPE U-SHAPE 47X51 STRL (DRAPES) IMPLANT
DRSG AQUACEL AG ADV 3.5X 6 (GAUZE/BANDAGES/DRESSINGS) ×1 IMPLANT
DRSG AQUACEL AG ADV 3.5X10 (GAUZE/BANDAGES/DRESSINGS) ×2 IMPLANT
ELECT BLADE 4.0 EZ CLEAN MEGAD (MISCELLANEOUS) ×3
ELECT CAUTERY BLADE 6.4 (BLADE) ×3 IMPLANT
ELECT REM PT RETURN 9FT ADLT (ELECTROSURGICAL) ×3
ELECTRODE BLDE 4.0 EZ CLN MEGD (MISCELLANEOUS) ×1 IMPLANT
ELECTRODE REM PT RTRN 9FT ADLT (ELECTROSURGICAL) ×1 IMPLANT
GLOVE BIOGEL PI IND STRL 8 (GLOVE) ×1 IMPLANT
GLOVE BIOGEL PI INDICATOR 8 (GLOVE) ×2
GLOVE BIOGEL PI ORTHO PRO SZ8 (GLOVE) ×2
GLOVE ECLIPSE 8.0 STRL XLNG CF (GLOVE) ×6 IMPLANT
GLOVE PI ORTHO PRO STRL SZ8 (GLOVE) ×1 IMPLANT
GLOVE SURG ORTHO 8.0 STRL STRW (GLOVE) ×3 IMPLANT
GOWN STRL REUS W/ TWL LRG LVL3 (GOWN DISPOSABLE) ×1 IMPLANT
GOWN STRL REUS W/ TWL XL LVL3 (GOWN DISPOSABLE) ×1 IMPLANT
GOWN STRL REUS W/TWL 2XL LVL3 (GOWN DISPOSABLE) IMPLANT
GOWN STRL REUS W/TWL LRG LVL3 (GOWN DISPOSABLE) ×2
GOWN STRL REUS W/TWL XL LVL3 (GOWN DISPOSABLE) ×2
GUIDEWIRE GLENOID 2.5X220 (WIRE) ×2 IMPLANT
HANDPIECE INTERPULSE COAX TIP (DISPOSABLE)
IMPL REVERSE SHOULDER 0X3.5 (Shoulder) IMPLANT
IMPLANT REVERSE SHOULDER 0X3.5 (Shoulder) ×3 IMPLANT
INSERT SHLD REV 42X6 ANGLE B (Insert) ×2 IMPLANT
KIT BASIN OR (CUSTOM PROCEDURE TRAY) ×3 IMPLANT
KIT STABILIZATION SHOULDER (MISCELLANEOUS) ×3 IMPLANT
KIT TURNOVER KIT B (KITS) ×3 IMPLANT
MANIFOLD NEPTUNE II (INSTRUMENTS) ×3 IMPLANT
NDL HYPO 25GX1X1/2 BEV (NEEDLE) IMPLANT
NDL MAYO TROCAR (NEEDLE) ×1 IMPLANT
NEEDLE HYPO 25GX1X1/2 BEV (NEEDLE) IMPLANT
NEEDLE MAYO TROCAR (NEEDLE) IMPLANT
NS IRRIG 1000ML POUR BTL (IV SOLUTION) ×3 IMPLANT
PACK SHOULDER (CUSTOM PROCEDURE TRAY) ×3 IMPLANT
PAD ARMBOARD 7.5X6 YLW CONV (MISCELLANEOUS) ×2 IMPLANT
RESTRAINT HEAD UNIVERSAL NS (MISCELLANEOUS) ×3 IMPLANT
SCREW BONE 6.5 OD 30 NON BIO (Screw) ×2 IMPLANT
SCREW PERIPHERAL 30 (Screw) ×2 IMPLANT
SCREW PERIPHERAL 42 (Screw) ×2 IMPLANT
SET HNDPC FAN SPRY TIP SCT (DISPOSABLE) ×1 IMPLANT
SET INTERPULSE LAVAGE W/TIP (ORTHOPEDIC DISPOSABLE SUPPLIES) ×2 IMPLANT
SLING ARM IMMOBILIZER LRG (SOFTGOODS) ×1 IMPLANT
SLING ULTRA II LG (MISCELLANEOUS) ×2 IMPLANT
SMARTMIX MINI TOWER (MISCELLANEOUS)
SPONGE LAP 18X18 RF (DISPOSABLE) ×5 IMPLANT
STEM LONG PTC HUMERALTI SZ 2B (Stem) ×2 IMPLANT
STRIP CLOSURE SKIN 1/2X4 (GAUZE/BANDAGES/DRESSINGS) ×1 IMPLANT
SUCTION FRAZIER HANDLE 10FR (MISCELLANEOUS) ×4
SUCTION TUBE FRAZIER 10FR DISP (MISCELLANEOUS) ×1 IMPLANT
SUT ETHIBOND 2 V 37 (SUTURE) ×3 IMPLANT
SUT ETHIBOND NAB CT1 #1 30IN (SUTURE) ×1 IMPLANT
SUT FIBERWIRE #5 38 CONV NDL (SUTURE) ×12
SUT MNCRL AB 3-0 PS2 18 (SUTURE) ×1 IMPLANT
SUT MNCRL AB 4-0 PS2 18 (SUTURE) ×2 IMPLANT
SUT MON AB 3-0 SH 27 (SUTURE)
SUT MON AB 3-0 SH27 (SUTURE) ×1 IMPLANT
SUT VIC AB 0 CT1 18XCR BRD 8 (SUTURE) ×1 IMPLANT
SUT VIC AB 0 CT1 8-18 (SUTURE)
SUT VIC AB 2-0 CT1 27 (SUTURE)
SUT VIC AB 2-0 CT1 TAPERPNT 27 (SUTURE) ×1 IMPLANT
SUT VIC AB 3-0 PS2 18 (SUTURE) ×2 IMPLANT
SUTURE FIBERWR #5 38 CONV NDL (SUTURE) ×6 IMPLANT
TOWEL GREEN STERILE (TOWEL DISPOSABLE) ×3 IMPLANT
TOWER CARTRIDGE SMART MIX (DISPOSABLE) IMPLANT
TOWER SMARTMIX MINI (MISCELLANEOUS) IMPLANT
TRAY FOLEY W/BAG SLVR 14FR (SET/KITS/TRAYS/PACK) IMPLANT
WATER STERILE IRR 1000ML POUR (IV SOLUTION) ×1 IMPLANT

## 2019-11-24 NOTE — Anesthesia Postprocedure Evaluation (Signed)
Anesthesia Post Note  Patient: Joe Bell  Procedure(s) Performed: SHOULDER HEMI-ARTHROPLASTY (Right Shoulder) REVERSE SHOULDER ARTHROPLASTY (Right Shoulder)     Patient location during evaluation: PACU Anesthesia Type: General Level of consciousness: awake and alert Pain management: pain level controlled Vital Signs Assessment: post-procedure vital signs reviewed and stable Respiratory status: spontaneous breathing, nonlabored ventilation and respiratory function stable Cardiovascular status: blood pressure returned to baseline and stable Postop Assessment: no apparent nausea or vomiting Anesthetic complications: no    Last Vitals:  Vitals:   11/24/19 1625 11/24/19 1640  BP: 117/81 114/85  Pulse: 86 77  Resp: 17 16  Temp:  36.8 C  SpO2: 100% 98%    Last Pain:  Vitals:   11/24/19 1640  TempSrc:   PainSc: 0-No pain                 Lowella Curb

## 2019-11-24 NOTE — Op Note (Signed)
Orthopaedic Surgery Operative Note (CSN: [id number])  Joe Bell  Oct 17, 1960 Date of Surgery: 11/24/2019   Diagnoses:  Right humeral head fracture dislocation   Procedure: Right reverse Total Shoulder Arthroplasty Right open shoulder dislocation treatment   Operative Finding We will plan for hemiarthroplasty secondary the patient's head fracture and dislocation however the patient superior cuff was torn with the exception of the teres minor.  Were able to repair the subscapularis with robust fixation around the stem.  He did have some breach of the proximal humeral shaft where the tuberosity of the subscapularis had been avulsed and we went with a longer stent because of this.  Good robust fixation at the end of the case and intact axillary nerve.  Post-operative plan: The patient will be NWB in sling.  The patient will be admitted overnight.  DVT prophylaxis not indicated in isolated upper extremity surgery patient with no specific risks factors.  Pain control with PRN pain medication preferring oral medicines.  Follow up plan will be scheduled in approximately 7 days for incision check and XR.  Physical therapy to start at 2 weeks.  Implants: Size 2 long flex, 0 high offset tray with a 42+6 poly-, glenosphere 42, 30 center screw with robust peripheral locking screw fixation of 42 mm.  25 standard baseplate.  Post-Op Diagnosis: Same Surgeons:Primary: Joe Gash, MD Assistants:Joe McBane PA-C Location: [facility] OR ROOM 05 Anesthesia: General with Exparel Interscalene Antibiotics: Ancef 2g preop, Vancomycin 1022m locally Tourniquet time: None Estimated Blood Loss: 1275Complications: None Specimens: None Implants: Implant Name Type Inv. Item Serial No. Manufacturer Lot No. LRB No. Used Action  BASEPLATE GLENOSPHERE 217GYSTD - [id number] BASEPLATE GLENOSPHERE 259FMSTD  [employer] [id number] Right 1 Implanted  BASEPLATE GLENOID STD REV 42 - [id number] BASEPLATE GLENOID  STD REV 42  [employer] [id number] 1 Implanted  STEM LONG PTC HUMERALTI SZ 2B - [id number] STEM LONG PTC HUMERALTI SZ 2B  [employer] [id number] 1 Implanted  INSERT SHLD REV 42X6 ANGLE B - [id number] INSERT SHLD REV 42X6 ANGLE B  [employer] [id number] 1 Implanted  IMPLANT REVERSE SHOULDER 0X3.5 - [id number] IMPLANT REVERSE SHOULDER 0X3.5  [employer] [id number] 1 Implanted  SCREW BONE 6.5 OD 30 NON BIO - [id number] SCREW BONE 6.5 OD 30 NON BIO  [employer]  Right 1 Implanted  SCREW PERIPHERAL 30 - [id number] SCREW PERIPHERAL 30  [employer]  Right 1 Implanted  SCREW PERIPHERAL 42 - [id number] SCREW PERIPHERAL 42  [employer]  Right 1 Implanted    Indications for Surgery:   Joe Bell a 59y.o. male with unfortunately his first tonic-clonic seizure and a fracture dislocation associated with this.  This patient CT demonstrated a fracture dislocation with a head involvement anteriorly and a locked dislocation.  I was consulted to manage the patient due to his complex shoulder injury.  We discussed hemiarthroplasty versus reverse social arthroplasty as the CT suggested an in the leading edge cuff tear.  Benefits and risks of operative and nonoperative management were discussed prior to surgery with patient/guardian(s) and informed consent form was completed.  Infection and need for further surgery were discussed as was prosthetic stability and cuff issues.  We additionally specifically discussed risks of axillary nerve injury, infection, periprosthetic fracture, continued pain and longevity of implants prior to beginning procedure.      Procedure:   The patient was identified in the preoperative holding area where  the surgical site was marked. Block placed by anesthesia with exparel.  The patient was taken to the OR where a procedural timeout was called and the above noted anesthesia was induced.  The patient was positioned beachchair on allen  table with spider arm positioner.  Preoperative antibiotics were dosed.  The patient's right shoulder was prepped and draped in the usual sterile fashion.  A second preoperative timeout was called.       Standard deltopectoral approach was performed with a #10 blade. We dissected down to the subcutaneous tissues and the cephalic vein was taken laterally with the deltoid. Clavipectoral fascia was incised in line with the incision. Deep retractors were placed.  We had failed with close reduction prior to anesthesia and perform an open reduction of the humeral head back onto the glenoid at this point.   The long of the biceps tendon was identified and there was significant tenosynovitis present.  Tenodesis was performed to the pectoralis tendon with #2 Ethibond. The remaining biceps was followed up into the rotator interval where it was released.   The subscapularis was fractured off and adherent to the cancellous bone of the tuberosity.  Were able to mobilize this and placed a sutures.  We continued releasing the capsule directly off of the osteophytes inferiorly all the way around the corner. This allowed [place] to dislocate the humeral head.   The humeral head showed significant crush injury to the anterior portion of the articular surface involving at least a third of the humeral head.  The decision was made that arthroplasty was indeed the right option for this patient.  The rotator cuff was carefully examined and noted to be irreperably torn.  The decision was confirmed that a reverse total shoulder was indicated for this patient.  There were osteophytes along the inferior humeral neck. The osteophytes were removed with an osteotome and a rongeur.  Osteophytes were removed with a rongeur and an osteotome and the anatomic neck was well visualized.     A humeral cutting guide was inserted down the intramedullary canal. The version was set at 20 of retroversion. Humeral osteotomy was performed with an  oscillating saw. The head fragment was passed off the back table. A starter awl was used to open the humeral canal. We next used T-handle straight sound reamers to ream up to an appropriate fit. A chisel was used to remove proximal humeral bone. We then broached starting with a size one broach and broaching up to size 2 longstem which obtained an appropriate fit.  We had reasonable fit with a short stem however due to the patient's anterior bone loss we felt that a longstem would likely provide better overall support.  The broach handle was removed. A cut protector was placed. The broach handle was removed and a cut protector was placed. The humerus was retracted posteriorly and we turned our attention to glenoid exposure.  The subscapularis was again identified and immediately we took care to palpate the axillary nerve anteriorly and verify its position with gentle palpation as well as the tug test.  We then released the SGHL with bovie cautery prior to placing a curved mayo at the junction of the anterior glenoid well above the axillary nerve and bluntly dissecting the subscapularis from the capsule.  We then carefully protected the axillary nerve as we gently released the inferior capsule to fully mobilize the subscapularis.  An anterior deltoid retractor was then placed as well as a small Hohmann retractor superiorly.  The glenoid was inspected and was relatively normal with exception of the posterior glenoid fracture associated with his dislocation but this did not undermine our fixation.  We did cheat our baseplate slightly anterior in the setting of this to avoid this bone however.  The glenoid drill guide was placed and used to drill a guide pin in the center but slightly anterior to center, inferior position. The glenoid face was then reamed concentrically over the guide wire. The center hole was drilled over the guidepin in a near anatomic angle of version. Next the  glenoid vault was drilled back  to a depth of 30 mm.  We tapped and then placed a 39m size baseplate with 255mlateralization was selected with a 6.5 mm x 30 mm length central screw.  The base plate was screwed into the glenoid vault obtaining secure fixation. We next placed superior and inferior locking screws for additional fixation.  Next a 42 mm glenosphere was selected and impacted onto the baseplate. The center screw was tightened.  We turned attention back to the humeral side. The cut protector was removed. We trialed with multiple size tray and polyethylene options and selected a 6 which provided good stability and range of motion without excess soft tissue tension. The offset was dialed in to match the normal anatomy. The shoulder was trialed.  There was good ROM in all planes and the shoulder was stable with no inferior translation.  The real humeral implants were opened after again confirming sizes.  The trial was removed. #5 Fiberwire x4 sutures passed through the humeral neck for subscap repair. The humeral component was press-fit obtaining a secure fit. A +0 high offset tray was selected and impacted onto the stem.  A 42+6 polyethylene liner was impacted onto the stem.  The joint was reduced and thoroughly irrigated with pulsatile lavage. Subscap was repaired back with #5 Fiberwire sutures through bone tunnels. Hemostasis was obtained. The deltopectoral interval was reapproximated with #1 Ethibond. The subcutaneous tissues were closed with 2-0 Vicryl and the skin was closed with running monocryl.    The wounds were cleaned and dried and an Aquacel dressing was placed. The drapes taken down. The arm was placed into sling with abduction pillow. Patient was awakened, extubated, and transferred to the recovery room in stable condition. There were no intraoperative complications. The sponge, needle, and attention counts were  correct at the end of the case.   CaNoemi ChapelPA-C, present and scrubbed throughout the case,  critical for completion in a timely fashion, and for retraction, instrumentation, closure.

## 2019-11-24 NOTE — Anesthesia Procedure Notes (Signed)
Anesthesia Regional Block: Interscalene brachial plexus block   Pre-Anesthetic Checklist: ,, timeout performed, Correct Patient, Correct Site, Correct Laterality, Correct Procedure, Correct Position, site marked, Risks and benefits discussed,  Surgical consent,  Pre-op evaluation,  At surgeon's request and post-op pain management  Laterality: Right  Prep: chloraprep       Needles:  Injection technique: Single-shot  Needle Type: Stimiplex     Needle Length: 9cm  Needle Gauge: 21     Additional Needles:   Procedures:,,,, ultrasound used (permanent image in chart),,,,  Narrative:  Start time: 11/24/2019 1:31 PM End time: 11/24/2019 1:36 PM Injection made incrementally with aspirations every 5 mL.  Performed by: Personally  Anesthesiologist: Lowella Curb, MD

## 2019-11-24 NOTE — Anesthesia Procedure Notes (Signed)
Procedure Name: Intubation Date/Time: 11/24/2019 2:20 PM Performed by: Marena Chancy, CRNA Pre-anesthesia Checklist: Patient identified, Emergency Drugs available, Suction available and Patient being monitored Patient Re-evaluated:Patient Re-evaluated prior to induction Oxygen Delivery Method: Circle System Utilized Preoxygenation: Pre-oxygenation with 100% oxygen Induction Type: IV induction Ventilation: Mask ventilation without difficulty Laryngoscope Size: Miller and 2 Grade View: Grade I Tube type: Oral Tube size: 7.5 mm Number of attempts: 1 Airway Equipment and Method: Stylet and Oral airway Placement Confirmation: ETT inserted through vocal cords under direct vision,  positive ETCO2 and breath sounds checked- equal and bilateral Tube secured with: Tape Dental Injury: Teeth and Oropharynx as per pre-operative assessment

## 2019-11-24 NOTE — Interval H&P Note (Signed)
-   As below

## 2019-11-24 NOTE — Anesthesia Preprocedure Evaluation (Signed)
Anesthesia Evaluation  Patient identified by MRN, date of birth, ID band Patient awake    Reviewed: Allergy & Precautions, NPO status , Patient's Chart, lab work & pertinent test results  Airway Mallampati: II  TM Distance: >3 FB Neck ROM: Full    Dental no notable dental hx.    Pulmonary neg pulmonary ROS,    Pulmonary exam normal breath sounds clear to auscultation       Cardiovascular negative cardio ROS Normal cardiovascular exam Rhythm:Regular Rate:Normal     Neuro/Psych Seizures -,  negative psych ROS   GI/Hepatic negative GI ROS, Neg liver ROS,   Endo/Other  negative endocrine ROS  Renal/GU negative Renal ROS  negative genitourinary   Musculoskeletal negative musculoskeletal ROS (+)   Abdominal   Peds negative pediatric ROS (+)  Hematology negative hematology ROS (+)   Anesthesia Other Findings   Reproductive/Obstetrics negative OB ROS                             Anesthesia Physical Anesthesia Plan  ASA: II  Anesthesia Plan: General   Post-op Pain Management:  Regional for Post-op pain   Induction: Intravenous  PONV Risk Score and Plan: 1 and Ondansetron and Treatment may vary due to age or medical condition  Airway Management Planned: Oral ETT  Additional Equipment:   Intra-op Plan:   Post-operative Plan: Extubation in OR  Informed Consent: I have reviewed the patients History and Physical, chart, labs and discussed the procedure including the risks, benefits and alternatives for the proposed anesthesia with the patient or authorized representative who has indicated his/her understanding and acceptance.     Dental advisory given  Plan Discussed with: CRNA  Anesthesia Plan Comments:         Anesthesia Quick Evaluation

## 2019-11-24 NOTE — Discharge Instructions (Signed)
Ophelia Charter MD, MPH Noemi Chapel, PA-C Joe Bell [address], [address] [phone] (fax)   POST-OPERATIVE INSTRUCTIONS    WOUND CARE ? You may leave the operative dressing in place until your follow-up appointment. ? KEEP THE INCISIONS CLEAN AND DRY. ? There may be a small amount of fluid/bleeding leaking at the surgical site. This is normal after surgery.  ? If it fills with liquid or blood please call us immediately to change it for you. ? Use the provided ice machine or Ice packs as often as possible for the first 3-4 days, then as needed for pain relief.  Keep a layer of cloth or a shirt between your skin and the cooling unit to prevent frost bite as it can get very cold.  SHOWERING: - You may shower on Post-Op Day #2.  - The dressing is water resistant but do not scrub it as it may start to peel up.   - You may remove the sling for showering, but keep a water resistant pillow under the arm to keep both the  elbow and shoulder away from the body (mimicking the abduction sling).  - Gently pat the area dry.  - Do not soak the shoulder in water. Do not go swimming in the pool or ocean until your sutures are removed. - KEEP THE INCISIONS CLEAN AND DRY.  EXERCISES ? Wear the sling at all times except when doing your exercises. You may remove the sling for showering, but keep the arm across the chest or in a secondary sling.    ? Accidental/Purposeful External Rotation and shoulder flexion (reaching behind you) is to be avoided at all costs for the first month. ? It is ok to come out of your sling if your are sitting and have assistance for eating.  Do not lift anything heavier than 1 pound until we discuss it further in clinic. ? Please perform the exercises:   . Elbow / Hand / Wrist  Range of Motion Exercises . Grip strengthening   REGIONAL ANESTHESIA (NERVE BLOCKS) . The anesthesia team may have performed a nerve block for you if  safe in the setting of your care.  This is a great tool used to minimize pain.  Typically the block may start wearing off overnight but the long acting medicine may last for 3-4 days.  The nerve block wearing off can be a challenging period but please utilize your as needed pain medications to try and manage this period.    POST-OP MEDICATIONS- Multimodal approach to pain control . In general your pain will be controlled with a combination of substances.  Prescriptions unless otherwise discussed are electronically sent to your pharmacy.  This is a carefully made plan we use to minimize narcotic use.    ? Meloxicam OR Celebrex - Anti-inflammatory medication taken on a scheduled basis ? Acetaminophen - Non-narcotic pain medicine taken on a scheduled basis  ? Oxycodone - This is a strong narcotic, to be used only on an "as needed" basis for pain. ? Zofran -  take as needed for nausea  Meloxicam/Celebrex - these are anti-inflammatory and pain relievers.  Do not take additional ibuprofen, naproxen or other NSAID while taking this medicine.   FOLLOW-UP ? If you develop a Fever (>101.5), Redness or Drainage from the surgical incision site, please call our office to arrange for an evaluation. ? Please call the office to schedule a follow-up appointment for a wound check, 7-10  days post-operatively.  IF YOU HAVE ANY QUESTIONS, PLEASE FEEL FREE TO CALL OUR OFFICE.  HELPFUL INFORMATION  . If you had a block, it will wear off between 8-24 hrs postop typically.  This is period when your pain may go from nearly zero to the pain you would have had post-op without the block.  This is an abrupt transition but nothing dangerous is happening.  You may take an extra dose of narcotic when this happens.  ? Your arm will be in a sling following surgery. You will be in this sling for the next 3-4 weeks.  I will let you know the exact duration at your follow-up visit.  ? You may be more comfortable sleeping in a  semi-seated position the first few nights following surgery.  Keep a pillow propped under the elbow and forearm for comfort.  If you have a recliner type of chair it might be beneficial.  If not that is fine too, but it would be helpful to sleep propped up with pillows behind your operated shoulder as well under your elbow and forearm.  This will reduce pulling on the suture lines.  ? When dressing, put your operative arm in the sleeve first.  When getting undressed, take your operative arm out last.  Loose fitting, button-down shirts are recommended.  ? In most states it is against the law to drive while your arm is in a sling. And certainly against the law to drive while taking narcotics.  ? You may return to work/school in the next couple of days when you feel up to it. Desk work and typing in the sling is fine.  ? We suggest you use the pain medication the first night prior to going to bed, in order to ease any pain when the anesthesia wears off. You should avoid taking pain medications on an empty stomach as it will make you nauseous.  ? Do not drink alcoholic beverages or take illicit drugs when taking pain medications.  ? Pain medication may make you constipated.  Below are a few solutions to try in this order: - Decrease the amount of pain medication if you aren't having pain. - Drink lots of decaffeinated fluids. - Drink prune juice and/or each dried prunes  o If the first 3 don't work start with additional solutions - Take Colace - an over-the-counter stool softener - Take Senokot - an over-the-counter laxative - Take Miralax - a stronger over-the-counter laxative

## 2019-11-24 NOTE — Progress Notes (Signed)
PROGRESS NOTE  Joe Bell URK:[id number] DOB: 06-28-1961 DOA: 11/22/2019 PCP: Patient, No Pcp Per  Brief History   This patient was admitted by my colleague, Dr. Dr. Antionette Char early this morning from the ED after he was found to be poorly responsive with agonal respirations at home. When EMS arrived he became more alert and was brought to the ED where he was found to have generalized tocnic-clonic seizure in the ED.   [facility] were consulted to admit the patient for further evaluation and care.  Neurology was consulted. The patient has been loaded with keppra and started on a regular dose. EEG is pending.  On 11/22/2019 the patient complained of pain in his right shoulder. CT of the right shoulder was performed this morning based upon an irregularity seen on x-ray performed yesterday. Orthopedic surgery was consulted. They plan to take the patient to surgery later today to reduce the fracture.  Consultants  . Orthopedic surgery . Neurology  Procedures  . EEG  Antibiotics   Anti-infectives (From admission, onward)   Start     Dose/Rate Route Frequency Ordered Stop   11/24/19 1529  vancomycin (VANCOCIN) powder  Status:  Discontinued       As needed 11/24/19 1529 11/24/19 1603   11/24/19 0600  ceFAZolin (ANCEF) IVPB 2g/100 mL premix     2 g 200 mL/hr over 30 Minutes Intravenous On call to O.R. 11/24/19 0007 11/24/19 1454      Subjective  The patient is resting quietly. No new complaints.  Objective   Vitals:  Vitals:   11/24/19 1350 11/24/19 1610  BP: (!) 148/88 122/81  Pulse: 69 81  Resp: 11 16  Temp:  98.4 F (36.9 C)  SpO2: 98% 100%   Exam:  Constitutional:  . The patient is awake, alert, and oriented x 3. No acute distress. Respiratory:  . No increased work of breathing. . No wheezes, rales, or rhonchi . No tactile fremitus Cardiovascular:  . Regular rate and rhythm . No murmurs, ectopy, or gallups. . No lateral PMI. No thrills. Abdomen:  . Abdomen  is soft, non-tender, non-distended . No hernias, masses, or organomegaly . Normoactive bowel sounds.  Musculoskeletal:  . No cyanosis, clubbing, or edema . Right shoulder is swollen and painful to palpation or movement. Skin:  . No rashes, lesions, ulcers . palpation of skin: no induration or nodules Neurologic:  . CN 2-12 intact . Sensation all 4 extremities intact Psychiatric:  . Mental status o Mood, affect appropriate o Orientation to person, place, time  . judgment and insight appear intact   I have personally reviewed the following:   Today's Data  . Vitals, CMP, CBC  Other Data  . EEG - negative MRI brain  - negative.  Scheduled Meds: . feeding supplement  296 mL Oral Once  . [MAR Hold] insulin aspart  0-9 Units Subcutaneous Q4H  . [MAR Hold] levETIRAcetam  500 mg Oral BID  . povidone-iodine  2 application Topical Once  . [MAR Hold] sodium chloride flush  3 mL Intravenous Q12H   Continuous Infusions: . dextrose 5% lactated ringers Stopped (11/24/19 1252)    Principal Problem:   First time seizure (HCC) Active Problems:   Hyperglycemia   Leukocytosis   Shoulder fracture, right   LOS: 0 days   A & P  Seizures: Isolated single seizure. No epilleptiform activity on EEG. No abnormality on MRI. Dr. Amada Jupiter does not feel that the patient required further work up or antiepileptic medication.  Fracture  of the Right Shoulder: Orthopedic surgery will take the patient to surgery later today to reduce this. I appreciate their assistance. I will continue the patient on keppra in the perioperative period.  Hyperglycemia: Reactive hyperglycemia  Leukocytosis: Reactive due to seizure and fracture of right shoulder.  I have seen and examined this patient myself. I have spent 32 minutes in his evaluation and care.  DVT prophylaxis: SCD's until head CT, then could start pharmacologic ppx  Code Status: Full  Family Communication: None Disposition Plan: Home  following surgery when cleared for discharge by surgery.  Kielan Dreisbach, DO [facility] Direct contact: see [email or web address]  7PM-7AM contact night coverage as above 11/24/2019, 4:24 PM  LOS: 0 days

## 2019-11-24 NOTE — Transfer of Care (Signed)
Immediate Anesthesia Transfer of Care Note  Patient: Joe Bell  Procedure(s) Performed: SHOULDER HEMI-ARTHROPLASTY (Right Shoulder) REVERSE SHOULDER ARTHROPLASTY (Right Shoulder)  Patient Location: PACU  Anesthesia Type:GA combined with regional for post-op pain  Level of Consciousness: awake, alert  and oriented  Airway & Oxygen Therapy: Patient Spontanous Breathing and Patient connected to face mask oxygen  Post-op Assessment: Report given to RN and Post -op Vital signs reviewed and stable  Post vital signs: Reviewed and stable  Last Vitals:  Vitals Value Taken Time  BP 122/81 11/24/19 1609  Temp    Pulse 83 11/24/19 1613  Resp 15 11/24/19 1613  SpO2 100 % 11/24/19 1613  Vitals shown include unvalidated device data.  Last Pain:  Vitals:   11/24/19 1610  TempSrc:   PainSc: (P) Asleep      Patients Stated Pain Goal: 1 (11/24/19 1252)  Complications: No apparent anesthesia complications

## 2019-11-24 NOTE — Progress Notes (Signed)
Orthopedic Tech Progress Note Patient Details:  Joe Bell 07-Sep-1961 [id number] OR RN called requesting a SHOULDER ABDUCTION PILLOW. Dropped off at OR desk Ortho Devices Type of Ortho Device: Shoulder abduction pillow Ortho Device/Splint Location: RUE Ortho Device/Splint Interventions: Other (comment)   Post Interventions Patient Tolerated: Other (comment) Instructions Provided: Other (comment)   Donald Pore 11/24/2019, 3:47 PM

## 2019-11-25 LAB — CBC WITH DIFFERENTIAL/PLATELET
Abs Immature Granulocytes: 0.03 10*3/uL (ref 0.00–0.07)
Basophils Absolute: 0 10*3/uL (ref 0.0–0.1)
Basophils Relative: 0 %
Eosinophils Absolute: 0 10*3/uL (ref 0.0–0.5)
Eosinophils Relative: 0 %
HCT: 35.7 % — ABNORMAL LOW (ref 39.0–52.0)
Hemoglobin: 11.4 g/dL — ABNORMAL LOW (ref 13.0–17.0)
Immature Granulocytes: 0 %
Lymphocytes Relative: 17 %
Lymphs Abs: 1.4 10*3/uL (ref 0.7–4.0)
MCH: 27 pg (ref 26.0–34.0)
MCHC: 31.9 g/dL (ref 30.0–36.0)
MCV: 84.6 fL (ref 80.0–100.0)
Monocytes Absolute: 0.6 10*3/uL (ref 0.1–1.0)
Monocytes Relative: 7 %
Neutro Abs: 6.1 10*3/uL (ref 1.7–7.7)
Neutrophils Relative %: 76 %
Platelets: 220 10*3/uL (ref 150–400)
RBC: 4.22 MIL/uL (ref 4.22–5.81)
RDW: 11.7 % (ref 11.5–15.5)
WBC: 8.1 10*3/uL (ref 4.0–10.5)
nRBC: 0 % (ref 0.0–0.2)

## 2019-11-25 LAB — GLUCOSE, CAPILLARY
Glucose-Capillary: 115 mg/dL — ABNORMAL HIGH (ref 70–99)
Glucose-Capillary: 108 mg/dL — ABNORMAL HIGH (ref 70–99)
Glucose-Capillary: 156 mg/dL — ABNORMAL HIGH (ref 70–99)
Glucose-Capillary: 107 mg/dL — ABNORMAL HIGH (ref 70–99)
Glucose-Capillary: 138 mg/dL — ABNORMAL HIGH (ref 70–99)
Glucose-Capillary: 112 mg/dL — ABNORMAL HIGH (ref 70–99)

## 2019-11-25 LAB — BASIC METABOLIC PANEL
Anion gap: 13 (ref 5–15)
BUN: 12 mg/dL (ref 8–23)
CO2: 25 mmol/L (ref 22–32)
Calcium: 7.9 mg/dL — ABNORMAL LOW (ref 8.9–10.3)
Chloride: 100 mmol/L (ref 98–111)
Creatinine, Ser: 0.96 mg/dL (ref 0.61–1.24)
GFR calc Af Amer: >60 mL/min (ref >60)
GFR calc non Af Amer: >60 mL/min (ref >60)
Glucose, Bld: 121 mg/dL — ABNORMAL HIGH (ref 70–99)
Potassium: 4.5 mmol/L (ref 3.5–5.1)
Sodium: 138 mmol/L (ref 135–145)

## 2019-11-25 NOTE — Progress Notes (Signed)
PROGRESS NOTE  Joe Bell KVQ:[id number] DOB: 20-Aug-1961 DOA: 11/22/2019 PCP: Patient, No Pcp Per  Brief History   This patient was admitted by my colleague, Dr. Dr. Myna Hidalgo early this morning from the ED after he was found to be poorly responsive with agonal respirations at home. When EMS arrived he became more alert and was brought to the ED where he was found to have generalized tocnic-clonic seizure in the ED.   [facility] were consulted to admit the patient for further evaluation and care.  Neurology was consulted. The patient has been loaded with keppra and started on a regular dose. EEG is pending.  On 11/22/2019 the patient complained of pain in his right shoulder. CT of the right shoulder was performed this morning based upon an irregularity seen on x-ray performed yesterday. Orthopedic surgery was consulted. Dr. Griffin Basil took the patient to surgery to reduce the right shoulder fracture on 11/24/2019.   Consultants  . Orthopedic surgery . Neurology  Procedures  . EEG  Antibiotics   Anti-infectives (From admission, onward)   Start     Dose/Rate Route Frequency Ordered Stop   11/24/19 2030  ceFAZolin (ANCEF) IVPB 1 g/50 mL premix     1 g 100 mL/hr over 30 Minutes Intravenous Every 6 hours 11/24/19 1722 11/25/19 0901   11/24/19 1529  vancomycin (VANCOCIN) powder  Status:  Discontinued       As needed 11/24/19 1529 11/24/19 1603   11/24/19 0600  ceFAZolin (ANCEF) IVPB 2g/100 mL premix     2 g 200 mL/hr over 30 Minutes Intravenous On call to O.R. 11/24/19 0007 11/24/19 1454      Subjective  The patient is resting quietly. Per Pt the patient is very unsteady on his feet today.  Objective   Vitals:  Vitals:   11/25/19 0838 11/25/19 1215  BP: 133/88 111/64  Pulse: 79 81  Resp: 18 18  Temp: 99 F (37.2 C) 98.7 F (37.1 C)  SpO2: 98% 98%   Exam:  Constitutional:  . The patient is awake, alert, and oriented x 3. No acute distress. Respiratory:  . No increased  work of breathing. . No wheezes, rales, or rhonchi . No tactile fremitus Cardiovascular:  . Regular rate and rhythm . No murmurs, ectopy, or gallups. . No lateral PMI. No thrills. Abdomen:  . Abdomen is soft, non-tender, non-distended . No hernias, masses, or organomegaly . Normoactive bowel sounds.  Musculoskeletal:  . No cyanosis, clubbing, or edema . Right shoulder is a little swollen and tender to touch. Bandage is clean and dry. Skin:  . No rashes, lesions, ulcers . palpation of skin: no induration or nodules Neurologic:  . CN 2-12 intact . Sensation all 4 extremities intact Psychiatric:  . Mental status o Mood, affect appropriate o Orientation to person, place, time  . judgment and insight appear intact   I have personally reviewed the following:   Today's Data  . Vitals, CMP, CBC  Other Data  . EEG - negative MRI brain  - negative.  Scheduled Meds: . acetaminophen  1,000 mg Oral Q8H  . celecoxib  200 mg Oral BID  . docusate sodium  100 mg Oral BID  . insulin aspart  0-9 Units Subcutaneous Q4H  . levETIRAcetam  500 mg Oral BID  . sodium chloride flush  3 mL Intravenous Q12H   Continuous Infusions: . dextrose 5% lactated ringers 50 mL/hr at 11/25/19 1400  . methocarbamol (ROBAXIN) IV      Principal Problem:  First time seizure [facility]) Active Problems:   Hyperglycemia   Leukocytosis   Shoulder fracture, right   LOS: 1 day   A & P  Seizures: Isolated single seizure. No epilleptiform activity on EEG. No abnormality on MRI. Dr. Amada Jupiter does not feel that the patient required further work up or antiepileptic medication.  Fracture of the Right Shoulder: ORIF on right shouldler on 11/24/2019 with Dr. Everardo Pacific. Clearance for dc, wound care, activity restrictions, follow up, pain control will be as per orthopedic surgery.I appreciate their assistance.  Hyperglycemia: Reactive hyperglycemia  Leukocytosis: Reactive due to seizure and fracture of right  shoulder.  Ambulatory dysfunction: Unsteady on feet per PT. Continue PT/OT.  I have seen and examined this patient myself. I have spent 30 minutes in his evaluation and care.  DVT prophylaxis: SCD's until head CT, then could start pharmacologic ppx  Code Status: Full  Family Communication: None Disposition Plan: Home following surgery when cleared for discharge by surgery, and safe on feet per PT.  Joe Sandiford, DO [facility] Direct contact: see [email or web address]  7PM-7AM contact night coverage as above 11/25/2019, 2:57 PM  LOS: 0 days

## 2019-11-25 NOTE — Progress Notes (Cosign Needed)
Subjective: 1 Day Post-Op s/p Procedure(s): SHOULDER HEMI-ARTHROPLASTY REVERSE SHOULDER ARTHROPLASTY   Patient is alert, oriented, walking with OT. Patient reports pain to be mild.   Denies chest pain, SOB. No nausea/vomiting or abdominal pain. No other complaints.    Objective:  PE: VITALS:   Vitals:   11/24/19 2200 11/24/19 2348 11/25/19 0354 11/25/19 0838  BP:  121/81 125/86 133/88  Pulse:  78 72 79  Resp: 14 19 19 18   Temp:  97.7 F (36.5 C) (!) 97.5 F (36.4 C) 99 F (37.2 C)  TempSrc:  Oral Oral Oral  SpO2:  94% 97% 98%  Weight:      Height:       Alert and oriented. Abdomen soft, non-tender RUE: RUE in sling immobilizer. Able to flex, extend, and abduct all fingers of right hand. Distal sensation and capillary refill intact. Incision site is clean, operative dressing intact, mild strikethrough.   LABS  Results for orders placed or performed during the hospital encounter of 11/22/19 (from the past 24 hour(s))  Glucose, capillary     Status: Abnormal   Collection Time: 11/24/19 11:52 AM  Result Value Ref Range   Glucose-Capillary 107 (H) 70 - 99 mg/dL  Glucose, capillary     Status: Abnormal   Collection Time: 11/24/19  5:17 PM  Result Value Ref Range   Glucose-Capillary 149 (H) 70 - 99 mg/dL   Comment 1 Notify RN    Comment 2 Document in Chart   Glucose, capillary     Status: Abnormal   Collection Time: 11/24/19  8:16 PM  Result Value Ref Range   Glucose-Capillary 151 (H) 70 - 99 mg/dL  Glucose, capillary     Status: Abnormal   Collection Time: 11/24/19 11:55 PM  Result Value Ref Range   Glucose-Capillary 154 (H) 70 - 99 mg/dL  CBC with Differential/Platelet     Status: Abnormal   Collection Time: 11/25/19  3:27 AM  Result Value Ref Range   WBC 8.1 4.0 - 10.5 K/uL   RBC 4.22 4.22 - 5.81 MIL/uL   Hemoglobin 11.4 (L) 13.0 - 17.0 g/dL   HCT 11/27/19 (L) 38.3 - 77.9 %   MCV 84.6 80.0 - 100.0 fL   MCH 27.0 26.0 - 34.0 pg   MCHC 31.9 30.0 - 36.0 g/dL   RDW 39.6 88.6 - 48.4 %   Platelets 220 150 - 400 K/uL   nRBC 0.0 0.0 - 0.2 %   Neutrophils Relative % 76 %   Neutro Abs 6.1 1.7 - 7.7 K/uL   Lymphocytes Relative 17 %   Lymphs Abs 1.4 0.7 - 4.0 K/uL   Monocytes Relative 7 %   Monocytes Absolute 0.6 0.1 - 1.0 K/uL   Eosinophils Relative 0 %   Eosinophils Absolute 0.0 0.0 - 0.5 K/uL   Basophils Relative 0 %   Basophils Absolute 0.0 0.0 - 0.1 K/uL   Immature Granulocytes 0 %   Abs Immature Granulocytes 0.03 0.00 - 0.07 K/uL  Basic metabolic panel     Status: Abnormal   Collection Time: 11/25/19  3:27 AM  Result Value Ref Range   Sodium 138 135 - 145 mmol/L   Potassium 4.5 3.5 - 5.1 mmol/L   Chloride 100 98 - 111 mmol/L   CO2 25 22 - 32 mmol/L   Glucose, Bld 121 (H) 70 - 99 mg/dL   BUN 12 8 - 23 mg/dL   Creatinine, Ser 11/27/19 0.61 - 1.24 mg/dL   Calcium  7.9 (L) 8.9 - 10.3 mg/dL   GFR calc non Af Amer >60 >60 mL/min   GFR calc Af Amer >60 >60 mL/min   Anion gap 13 5 - 15  Glucose, capillary     Status: Abnormal   Collection Time: 11/25/19  3:54 AM  Result Value Ref Range   Glucose-Capillary 115 (H) 70 - 99 mg/dL  Glucose, capillary     Status: Abnormal   Collection Time: 11/25/19  8:36 AM  Result Value Ref Range   Glucose-Capillary 108 (H) 70 - 99 mg/dL    EEG  Result Date: 11/23/2019 Lora Havens, MD     11/23/2019  2:03 PM Patient Name: Joe Bell MRN: [id number] Epilepsy Attending: Lora Havens Referring Physician/Provider: Dr. Mitzi Hansen Date: 11/22/2019 Duration: 26.02 minutes Patient history: 59 year old male presented with new onset seizure.  EEG to evaluate for epilepsy. Level of alertness: Awake, sleep AEDs during EEG study: Keppra, Ativan Technical aspects: This EEG study was done with scalp electrodes positioned according to the 10-20 International system of electrode placement. Electrical activity was acquired at a sampling rate of 500Hz  and reviewed with a high frequency filter of 70Hz  and a low frequency filter  of 1Hz . EEG data were recorded continuously and digitally stored. Description: During awake state, no clear posterior dominant was seen.  Sleep was characterized by sleep spindles (12 to 14 Hz), maximal frontocentral as well as slow-wave sleep.  EEG also showed continuous generalized 3-5 theta-delta slowing admixed with an excessive amount of 15 to 18 Hz, 2-3 uV beta activity with irregular morphology distributed symmetrically and diffusely.  Hyperventilation and photic stimulation were not performed. Abnormality -Continuous slow, generalized -Excessive beta, generalized IMPRESSION: This study is suggestive of mild to moderate diffuse encephalopathy, nonspecific to etiology. The excessive beta activity seen in the background is most likely due to the effect of benzodiazepine and is a benign EEG pattern. No seizures or epileptiform discharges were seen throughout the recording. Lora Havens   MR BRAIN WO CONTRAST  Result Date: 11/23/2019 CLINICAL DATA:  Altered mental status. Seizures. EXAM: MRI HEAD WITHOUT CONTRAST TECHNIQUE: Multiplanar, multiecho pulse sequences of the brain and surrounding structures were obtained without intravenous contrast. COMPARISON:  None. FINDINGS: Images are severely degraded by motion. Brain: No acute infarction, hemorrhage, hydrocephalus, extra-axial collection or large mass lesion. Vascular: Normal flow voids. Skull and upper cervical spine: Normal marrow signal. Sinuses/Orbits: Negative. Other: None. IMPRESSION: 1. Severely motion degraded study. 2. No acute intracranial abnormality identified. Electronically Signed   By: Pedro Earls M.D.   On: 11/23/2019 13:01   DG Shoulder Right Port  Result Date: 11/24/2019 CLINICAL DATA:  Right shoulder surgery. EXAM: PORTABLE RIGHT SHOULDER COMPARISON:  CT 11/22/2018. FINDINGS: Total right shoulder replacement. Hardware intact. Anatomic alignment. IMPRESSION: Total right shoulder replacement with anatomic alignment.  Electronically Signed   By: Marcello Moores  Register   On: 11/24/2019 17:11    Assessment/Plan: Principal Problem:   First time seizure ([place]) Active Problems:   Hyperglycemia   Leukocytosis   Shoulder fracture, right    1 Day Post-Op s/p Procedure(s): SHOULDER HEMI-ARTHROPLASTY REVERSE SHOULDER ARTHROPLASTY  Weightbearing: Non-weightbearing, in sling RUE Insicional and dressing care: PRN dressing changes VTE prophylaxis: Not indicated Pain control: PRN pain control Follow - up plan: Follow up with Dr. Griffin Basil in one week for incision check and x-ray. Physical therapy to start at 2 weeks. Patient to keep operative dressing on until follow-up. Continue non-weightbearing status in RUE until follow-up.  OT  recommending PT consult for stability. Will add in consult, otherwise ok to discharge today from orthopedic standpoint.   Contact information:   Weekdays 8-5 [address], [place] [phone]   Armida Sans 11/25/2019, 11:24 AM

## 2019-11-25 NOTE — Evaluation (Signed)
Occupational Therapy Evaluation Patient Details Name: Joe Bell MRN: [id number] DOB: [birth year] Today's Date: 11/25/2019    History of Present Illness 59 y/o male admitted yesterday with his first tonic-clonic seizure. This resulted in R humeral head fx s/p  R reverse TSA and dislocation treatment.   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 He speaks [place] and conversational English. At time of eval, he is able to complete bed mobility and sit <> stands with supervision for safety. Pt moving impulsively throughout session. Education given on NWB precautions, sling management, and RUE permitted exercise as described below. Pt impulsive and unsteady with functional mobility, informed PA for PT consult. Suspect some cognitive deficits since recent seizure described below. Recommend he follow up with outpatient rehab as shoulder progresses per MD plan. Will continue to follow per POC listed below.    Follow Up Recommendations  Follow surgeon's recommendation for DC plan and follow-up therapies;Outpatient OT;Supervision - Intermittent    Equipment Recommendations  None recommended by OT    Recommendations for Other Services       Precautions / Restrictions Precautions Precautions: Shoulder Shoulder Interventions: Timmothy Sours joy ultra sling;At all times Precaution Booklet Issued: No Precaution Comments: pt speaks [place] Restrictions Weight Bearing Restrictions: Yes RUE Weight Bearing: Non weight bearing      Mobility Bed Mobility Overal bed mobility: Needs Assistance Bed Mobility: Sidelying to Sit   Sidelying to sit: Supervision       General bed mobility comments: supervision for safety  Transfers Overall transfer level: Needs assistance   Transfers: Sit to/from Stand Sit to Stand: Min guard;Min assist         General transfer comment: up to min A for LOB correction    Balance Overall balance assessment: Needs assistance Sitting-balance support:  Single extremity supported;Feet supported Sitting balance-Leahy Scale: Good     Standing balance support: No upper extremity supported;During functional activity Standing balance-Leahy Scale: Fair                             ADL either performed or assessed with clinical judgement   ADL Overall ADL's : Needs assistance/impaired Eating/Feeding: Set up;Sitting   Grooming: Set up;Sitting   Upper Body Bathing: Minimal assistance;Sitting;Adhering to UE precautions   Lower Body Bathing: Minimal assistance;Sit to/from stand   Upper Body Dressing : Minimal assistance;Sitting;Adhering to UE precautions   Lower Body Dressing: Minimal assistance;Sit to/from stand   Toilet Transfer: Min guard   Toileting- Water quality scientist and Hygiene: Min guard   Tub/ Banker: Min guard   Functional mobility during ADLs: Min guard;Minimal assistance;Cueing for safety General ADL Comments: Pt impulsive and requiring increased safety cues     Vision   Additional Comments: was [facility] for session     Perception     Praxis      Pertinent Vitals/Pain Pain Assessment: Faces Faces Pain Scale: Hurts little more Pain Location: R shoulder Pain Descriptors / Indicators: Aching;Sore Pain Intervention(s): Limited activity within patient's tolerance;Monitored during session;Ice applied     Hand Dominance     Extremity/Trunk Assessment Upper Extremity Assessment Upper Extremity Assessment: RUE deficits/detail RUE Deficits / Details: immobilized in don joy sling. Pt able to complete elbow ROM and wrist/hand ROM   Lower Extremity Assessment Lower Extremity Assessment: Defer to PT evaluation       Communication Communication Communication: Prefers language other than [place];Other (comment)([place])   Cognition Arousal/Alertness: Awake/alert Behavior During Therapy: Impulsive Overall  Cognitive Status: Impaired/Different from baseline Area of Impairment: Problem  solving;Safety/judgement                         Safety/Judgement: Decreased awareness of deficits;Decreased awareness of safety   Problem Solving: Decreased initiation;Requires verbal cues General Comments: limited full assessment due to language barrier. Pt impulsive and needing safety cues throughout session   General Comments       Exercises Shoulder Exercises Elbow Flexion: 5 reps;Right;Seated;AROM Elbow Extension: AROM;Right;5 reps;Seated Wrist Flexion: AROM;Right;5 reps;Seated Wrist Extension: AROM;Right;5 reps;Seated Digit Composite Flexion: AROM;Right;5 reps;Seated Composite Extension: AROM;Right;5 reps;Seated   Shoulder Instructions Shoulder Instructions Donning/doffing shirt without moving shoulder: Minimal assistance Method for sponge bathing under operated UE: Supervision/safety Donning/doffing sling/immobilizer: Minimal assistance Correct positioning of sling/immobilizer: Supervision/safety ROM for elbow, wrist and digits of operated UE: Supervision/safety Sling wearing schedule (on at all times/off for ADL's): Supervision/safety    Home Living Family/patient expects to be discharged to:: Private residence   Available Help at Discharge: Friend(s);Family                                    Prior Functioning/Environment          Comments: language barrier limiting. Pt shares that he lives with family and has a male friend is close by to help. 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, hospital Problem List: Decreased strength;Decreased knowledge of use of DME or AE;Decreased range of motion;Decreased coordination;Decreased knowledge of precautions;Decreased activity tolerance;Decreased cognition;Impaired UE functional use;Impaired balance (sitting and/or standing);Pain;Decreased safety awareness      OT Treatment/Interventions:      OT Goals(Current goals can be found in the care plan section) Acute Rehab OT Goals Patient  Stated Goal: go home OT Goal Formulation: With patient Time For Goal Achievement: 12/09/19 Potential to Achieve Goals: Good  OT Frequency:     Barriers to D/C:            Co-evaluation              AM-PAC OT "6 Clicks" Daily Activity     Outcome Measure Help from another person eating meals?: None Help from another person taking care of personal grooming?: None Help from another person toileting, which includes using toliet, bedpan, or urinal?: A Little Help from another person bathing (including washing, rinsing, drying)?: A Little Help from another person to put on and taking off regular upper body clothing?: A Little Help from another person to put on and taking off regular lower body clothing?: A Little 6 Click Score: 20   End of Session Equipment Utilized During Treatment: Other (comment)(sling) Nurse Communication: Mobility status;Precautions;Weight bearing status  Activity Tolerance: Patient tolerated treatment well Patient left: in bed;with call bell/phone within reach;with bed alarm set  OT Visit Diagnosis: Other abnormalities of gait and mobility (R26.89);Other symptoms and signs involving the nervous system (R29.898);Pain Pain - Right/Left: Right Pain - part of body: Shoulder                Time: 2836-6294 OT Time Calculation (min): 20 min Charges:  OT General Charges $OT Visit: 1 Visit OT Evaluation $OT Eval Moderate Complexity: 1 Mod  Dalphine Handing, MSOT, OTR/L Acute Rehabilitation Services [facility] Office Number: [phone]  Dalphine Handing 11/25/2019, 2:52 PM

## 2019-11-25 NOTE — Evaluation (Signed)
Physical Therapy Evaluation Patient Details Name: Joe Bell MRN: [id number] DOB: April 01, 1961 Today's Date: 11/25/2019   History of Present Illness  59 y/o male admitted yesterday with his first tonic-clonic seizure. This resulted in R humeral head fx s/p  R reverse TSA and dislocation treatment.  Clinical Impression  Pt admitted with above. Demonstrates instability with gait requiring min assist early in bout due to LOB. Progressed to supervision level however drifting towards left and demonstrates decreased awareness on Rt requiring cues to prevent running into things with operative shoulder. Translated interaction through pts personal translator and friend who is assisting with planning through family phone conversations during evaluation. Pt reports he will have 24/7 assistance at home. Educated extensively on safety with mobility and need for family support to protect shoulder and provide close guard assist when walking until he feels more stable. Pt and translator/friend/family verbalize understanding. Pt currently with functional limitations due to the deficits listed below (see PT Problem List). Pt will benefit from skilled PT to increase their independence and safety with mobility to allow discharge to the venue listed below.       Follow Up Recommendations Follow surgeon's recommendation for DC plan and follow-up therapies;Supervision for mobility/OOB    Equipment Recommendations  Cane    Recommendations for Other Services       Precautions / Restrictions Precautions Precautions: Shoulder Shoulder Interventions: Don joy ultra sling;At all times Precaution Booklet Issued: No Precaution Comments: pt speaks [place] Restrictions Weight Bearing Restrictions: Yes RUE Weight Bearing: Non weight bearing      Mobility  Bed Mobility Overal bed mobility: Needs Assistance Bed Mobility: Sidelying to Sit   Sidelying to sit: Supervision       General bed mobility comments: sitting  EOB  Transfers Overall transfer level: Needs assistance Equipment used: None Transfers: Sit to/from Stand Sit to Stand: Supervision         General transfer comment: No phsyical assist required, slow and a little unsteady upon standing but able to self correct. A bit impulsive once upright, cues to remain stationary until stable.  Ambulation/Gait Ambulation/Gait assistance: Min Chemical engineer (Feet): 125 Feet Assistive device: None;IV Pole Gait Pattern/deviations: Step-through pattern;Decreased stride length;Staggering left;Staggering right;Drifts right/left Gait velocity: decreased   General Gait Details: Intermittent drifting towards Left, improved with single UE support with IV pole. Occasional staggering. VC to correct and for awareness. No buckling however min assist required due to LOB towards left early in bout, improved with distance. Delayed responses with cues.  Stairs            Wheelchair Mobility    Modified Rankin (Stroke Patients Only)       Balance Overall balance assessment: Needs assistance Sitting-balance support: No upper extremity supported Sitting balance-Leahy Scale: Good     Standing balance support: No upper extremity supported Standing balance-Leahy Scale: Good   Single Leg Stance - Right Leg: 10 Single Leg Stance - Left Leg: 10 Tandem Stance - Right Leg: 6   Rhomberg - Eyes Opened: 10 Rhomberg - Eyes Closed: 10                 Pertinent Vitals/Pain Pain Assessment: Faces Faces Pain Scale: Hurts little more Pain Location: Rt shoulder Pain Descriptors / Indicators: Aching;Sore Pain Intervention(s): Limited activity within patient's tolerance;Monitored during session;Repositioned    Home Living Family/patient expects to be discharged to:: Private residence Living Arrangements: Other relatives Available Help at Discharge: Friend(s);Family Type of Home: House Home Access: Level entry  Home Layout: One level Home  Equipment: None      Prior Function Level of Independence: Independent         Comments: Pt reports he lives with two other people who will be available to assist 24/7 when he returns home.     Hand Dominance        Extremity/Trunk Assessment   Upper Extremity Assessment Upper Extremity Assessment: Defer to OT evaluation RUE Deficits / Details: immobilized in don joy sling. Pt able to complete elbow ROM and wrist/hand ROM    Lower Extremity Assessment Lower Extremity Assessment: Generalized weakness       Communication   Communication: Prefers language other than [place];Other (comment)(H[place]a)  Cognition Arousal/Alertness: Awake/alert Behavior During Therapy: Impulsive Overall Cognitive Status: Impaired/Different from baseline Area of Impairment: Problem solving;Safety/judgement                         Safety/Judgement: Decreased awareness of deficits;Decreased awareness of safety   Problem Solving: Decreased initiation;Requires verbal cues General Comments: Decreased awareness of instability and proximity of Rt shoulder to objects such as door and wall when walking.      General Comments General comments (skin integrity, edema, etc.): Translation through personal translator in room. In sling, ice pack applied, repositioned    Exercises Shoulder Exercises Elbow Flexion: 5 reps;Right;Seated;AROM Elbow Extension: AROM;Right;5 reps;Seated Wrist Flexion: AROM;Right;5 reps;Seated Wrist Extension: AROM;Right;5 reps;Seated Digit Composite Flexion: AROM;Right;5 reps;Seated Composite Extension: AROM;Right;5 reps;Seated Donning/doffing shirt without moving shoulder: Minimal assistance Method for sponge bathing under operated UE: Supervision/safety Donning/doffing sling/immobilizer: Minimal assistance Correct positioning of sling/immobilizer: Supervision/safety ROM for elbow, wrist and digits of operated UE: Supervision/safety Sling wearing schedule (on at all  times/off for ADL's): Supervision/safety   Assessment/Plan    PT Assessment Patient needs continued PT services  PT Problem List Decreased strength;Decreased range of motion;Decreased activity tolerance;Decreased balance;Decreased mobility;Decreased coordination;Decreased knowledge of use of DME;Decreased knowledge of precautions;Decreased safety awareness       PT Treatment Interventions DME instruction;Gait training;Functional mobility training;Therapeutic activities;Therapeutic exercise;Neuromuscular re-education;Balance training;Patient/family education    PT Goals (Current goals can be found in the Care Plan section)  Acute Rehab PT Goals Patient Stated Goal: go home PT Goal Formulation: With patient Time For Goal Achievement: 12/09/19 Potential to Achieve Goals: Good    Frequency Min 3X/week   Barriers to discharge        Co-evaluation               AM-PAC PT "6 Clicks" Mobility  Outcome Measure Help needed turning from your back to your side while in a flat bed without using bedrails?: None Help needed moving from lying on your back to sitting on the side of a flat bed without using bedrails?: None Help needed moving to and from a bed to a chair (including a wheelchair)?: None Help needed standing up from a chair using your arms (e.g., wheelchair or bedside chair)?: A Little Help needed to walk in hospital room?: A Little Help needed climbing 3-5 steps with a railing? : A Little 6 Click Score: 21    End of Session Equipment Utilized During Treatment: Gait belt(shoulder sling) Activity Tolerance: Patient tolerated treatment well Patient left: in chair;with chair alarm set;with nursing/sitter in room Nurse Communication: Mobility status PT Visit Diagnosis: Unsteadiness on feet (R26.81);Other abnormalities of gait and mobility (R26.89);Pain Pain - Right/Left: Right Pain - part of body: Shoulder    Time: 3716-9678 PT Time Calculation (min) (ACUTE ONLY): 25  min   Charges:   PT Evaluation $PT Eval Low Complexity: 1 Low PT Treatments $Gait Training: 8-22 mins        Charlsie Merles, PT Acute Rehabilitation Services  [place] 11/25/2019, 3:51 PM

## 2019-11-26 LAB — GLUCOSE, CAPILLARY
Glucose-Capillary: 107 mg/dL — ABNORMAL HIGH (ref 70–99)
Glucose-Capillary: 199 mg/dL — ABNORMAL HIGH (ref 70–99)
Glucose-Capillary: 80 mg/dL (ref 70–99)

## 2019-11-26 MED ORDER — OXYCODONE HCL 5 MG PO TABS: 5.0000 mg | ORAL_TABLET | ORAL | 0 refills | Status: DC | PRN

## 2019-11-26 MED ORDER — ONDANSETRON HCL 4 MG PO TABS: 4.0000 mg | ORAL_TABLET | Freq: Three times a day (TID) | ORAL | 0 refills | Status: DC | PRN

## 2019-11-26 MED ORDER — SENNA-DOCUSATE SODIUM 8.6-50 MG PO TABS: 2.0000 | ORAL_TABLET | Freq: Every day | ORAL | 1 refills | Status: DC

## 2019-11-26 MED ORDER — BACLOFEN 10 MG PO TABS: 10.0000 mg | ORAL_TABLET | Freq: Three times a day (TID) | ORAL | 0 refills | Status: DC

## 2019-11-26 NOTE — Discharge Summary (Signed)
Physician Discharge Summary  Joe Bell DSK:[id number] DOB: 1961-01-03 DOA: 11/22/2019  PCP: Patient, No Pcp Per  Admit date: 11/22/2019 Discharge date: 11/26/2019  Recommendations for Outpatient Follow-up:  1. Follow up with PCP in 7-10 days. 2. No driving until cleared by orthopedic surgery. 3. Follow up with Dr. Everardo Pacific in one week for incision check and x-ray. 4. Non-weight bearing Rt upper extremity in sling immobilizer 5. Leave operative dressing in place until follow up with [facility] in one week.  Follow-up Information    Bjorn Pippin, MD In 1 week.   Specialty: Orthopedic Surgery Why: For wound re-check Contact information: [address] [place] [address] [phone]          Discharge Diagnoses: Principal diagnosis is #1 1. Isolated single seizure. No seizure disorder 2. Right shoulder fracture 3. Reactive hyperglycemia 4. Reactive Leukocytosis 5. Ambulatory dysfunction  Discharge Condition: Fair  Disposition: Home  Diet recommendation: Regular  Filed Weights   11/22/19 0523 11/24/19 1258  Weight: 87 kg 87 kg    History of present illness:   Joe Bell is a 59 y.o. male with no known past medical history, now presenting to the emergency department after he was found to be poorly responsive with abnormal breathing at home.  Patient is accompanied by a friend who lives in the same house, reports that the patient seemed to be in his usual state yesterday evening, is not known to use alcohol or illicit substances, and does not not have any known chronic illness or seizure history.  Patient was reportedly noted to have abnormal respirations while lying on a couch, could not be aroused, and EMS was called.  In the presence of EMS, the patient began to become more alert and was brought into the ED.  ED Course: Upon arrival to the ED, patient is found to be afebrile, saturating mid 90s on room air, and with stable blood pressure.  EKG features a sinus  rhythm.  Chemistry panel with mild metabolic acidosis and glucose 364.  CBC notable for leukocytosis to 15,500.  Patient had generalized seizure like activity in the emergency department, was given 1 mg IV Ativan, 1 g IV Keppra, and neurology was consulted by the ED physician.  Neurology has seen the patient in the emergency department and recommends noncontrast head CT, MRI brain with and without contrast if head CT is negative, EEG, and continued Keppra.  Hospital Course:  This patient was admitted by my colleague, Dr. Dr. Antionette Char early this morning from the ED after he was found to be poorly responsive with agonal respirations at home. When EMS arrived he became more alert and was brought to the ED where he was found to have generalized tocnic-clonic seizure in the ED.   [facility] were consulted to admit the patient for further evaluation and care.  Neurology was consulted. The patient has been loaded with keppra and started on a regular dose. EEG is pending.  On 11/22/2019 the patient complained of pain in his right shoulder. CT of the right shoulder was performed this morning based upon an irregularity seen on x-ray performed yesterday. Orthopedic surgery was consulted. Dr. Everardo Pacific took the patient to surgery to reduce the right shoulder fracture on 11/24/2019.   Today's assessment: S: The patient is resting comfortably. No new complaints. O: Vitals:  Vitals:   11/26/19 0737 11/26/19 1127  BP: (!) 131/94 129/87  Pulse: 84 87  Resp: 16 18  Temp: 98.7 F (37.1 C) 98.9  F (37.2 C)  SpO2: 98% 92%   Exam:  Constitutional:   The patient is awake, alert, and oriented x 3. No acute distress. Respiratory:   No increased work of breathing.  No wheezes, rales, or rhonchi  No tactile fremitus Cardiovascular:   Regular rate and rhythm  No murmurs, ectopy, or gallups.  No lateral PMI. No thrills. Abdomen:   Abdomen is soft, non-tender, non-distended  No hernias, masses,  or organomegaly  Normoactive bowel sounds.  Musculoskeletal:   No cyanosis, clubbing, or edema  Right shoulder is a little swollen and tender to touch. Bandage is clean and dry. Skin:   No rashes, lesions, ulcers  palpation of skin: no induration or nodules Neurologic:   CN 2-12 intact  Sensation all 4 extremities intact Psychiatric:   Mental status ? Mood, affect appropriate ? Orientation to person, place, time   judgment and insight appear intact  Discharge Instructions  Discharge Instructions    Activity as tolerated - No restrictions   Complete by: As directed    Call MD for:  redness, tenderness, or signs of infection (pain, swelling, redness, odor or green/yellow discharge around incision site)   Complete by: As directed    Call MD for:  severe uncontrolled pain   Complete by: As directed    Call MD for:  temperature >100.4   Complete by: As directed    Diet - low sodium heart healthy   Complete by: As directed    Discharge instructions   Complete by: As directed    Follow up with PCP in 7-10 days. No driving until cleared by orthopedic surgery. Follow up with Dr. Everardo Pacific in one week for incision check and x-ray. Non-weight bearing Rt upper extremity in sling immobilizer Leave operative dressing in place until follow up with [facility] in one week. Use cane for ambulation. Start physical therapy in 2 weeks.   Increase activity slowly   Complete by: As directed      Allergies as of 11/26/2019   No Known Allergies     Medication List    TAKE these medications   baclofen 10 MG tablet Commonly known as: LIORESAL Take 1 tablet (10 mg total) by mouth 3 (three) times daily. As needed for muscle spasm   ondansetron 4 MG tablet Commonly known as: Zofran Take 1 tablet (4 mg total) by mouth every 8 (eight) hours as needed for nausea or vomiting.   oxyCODONE 5 MG immediate release tablet Commonly known as: Roxicodone Take 1 tablet (5 mg total) by mouth every 4  (four) hours as needed for severe pain.   sennosides-docusate sodium 8.6-50 MG tablet Commonly known as: SENOKOT-S Take 2 tablets by mouth daily.            Durable Medical Equipment  (From admission, onward)         Start     Ordered   11/26/19 0937  DME Cane  Once     11/26/19 6734         No Known Allergies  The results of significant diagnostics from this hospitalization (including imaging, microbiology, ancillary and laboratory) are listed below for reference.    Significant Diagnostic Studies: EEG  Result Date: 11/23/2019 Charlsie Quest, MD     11/23/2019  2:03 PM Patient Name: Joe Bell MRN: [id number] Epilepsy Attending: Charlsie Quest Referring Physician/Provider: Dr. Odie Sera Date: 11/22/2019 Duration: 26.02 minutes Patient history: 59 year old male presented with new onset seizure.  EEG to evaluate for epilepsy.  Level of alertness: Awake, sleep AEDs during EEG study: Keppra, Ativan Technical aspects: This EEG study was done with scalp electrodes positioned according to the 10-20 International system of electrode placement. Electrical activity was acquired at a sampling rate of 500Hz  and reviewed with a high frequency filter of 70Hz  and a low frequency filter of 1Hz . EEG data were recorded continuously and digitally stored. Description: During awake state, no clear posterior dominant was seen.  Sleep was characterized by sleep spindles (12 to 14 Hz), maximal frontocentral as well as slow-wave sleep.  EEG also showed continuous generalized 3-5 theta-delta slowing admixed with an excessive amount of 15 to 18 Hz, 2-3 uV beta activity with irregular morphology distributed symmetrically and diffusely.  Hyperventilation and photic stimulation were not performed. Abnormality -Continuous slow, generalized -Excessive beta, generalized IMPRESSION: This study is suggestive of mild to moderate diffuse encephalopathy, nonspecific to etiology. The excessive beta activity seen in the  background is most likely due to the effect of benzodiazepine and is a benign EEG pattern. No seizures or epileptiform discharges were seen throughout the recording.   DG Shoulder Right  Result Date: 11/22/2019 CLINICAL DATA:  Shoulder pain EXAM: RIGHT SHOULDER - 2+ VIEW COMPARISON:  None. FINDINGS: AC joint appears intact. No humeral head dislocation. Osseous deformity along the inferior aspect of the humerus IMPRESSION: Osseous deformity along the inferior aspect of the right humeral head and neck, may relate to fracture deformity of uncertain age. CT could be obtained for further evaluation. Electronically Signed   By: M.D.   On: 11/22/2019 17:39   DG Abd 1 View  Result Date: 11/22/2019 CLINICAL DATA:  Unresponsive EXAM: ABDOMEN - 1 VIEW COMPARISON:  None. FINDINGS: Non inclusion of the left upper quadrant. Nonobstructed gas pattern with moderate stool in the colon. No radiopaque foreign body. Probable phleboliths in the left pelvis. IMPRESSION: Negative, allowing for incomplete inclusion of the left upper quadrant of the abdomen Electronically Signed   By: Jasmine Pang M.D.   On: 11/22/2019 17:41   CT Head Wo Contrast  Result Date: 11/22/2019 CLINICAL DATA:  Nontraumatic seizure EXAM: CT HEAD WITHOUT CONTRAST TECHNIQUE: Contiguous axial images were obtained from the base of the skull through the vertex without intravenous contrast. COMPARISON:  None. FINDINGS: Brain: No evidence of acute infarction, hemorrhage, hydrocephalus, extra-axial collection or mass lesion/mass effect. Normal cortical finding to explain seizure. Vascular: No hyperdense vessel or unexpected calcification. Skull: Negative Sinuses/Orbits: Negative IMPRESSION: Negative head CT. Electronically Signed   By: Jasmine Pang M.D.   On: 11/22/2019 04:47   MR BRAIN WO CONTRAST  Result Date: 11/23/2019 CLINICAL DATA:  Altered mental status. Seizures. EXAM: MRI HEAD WITHOUT CONTRAST TECHNIQUE:  Multiplanar, multiecho pulse sequences of the brain and surrounding structures were obtained without intravenous contrast. COMPARISON:  None. FINDINGS: Images are severely degraded by motion. Brain: No acute infarction, hemorrhage, hydrocephalus, extra-axial collection or large mass lesion. Vascular: Normal flow voids. Skull and upper cervical spine: Normal marrow signal. Sinuses/Orbits: Negative. Other: None. IMPRESSION: 1. Severely motion degraded study. 2. No acute intracranial abnormality identified. Electronically Signed   By: Marnee Spring M.D.   On: 11/23/2019 13:01   CT SHOULDER RIGHT WO CONTRAST  Result Date: 11/23/2019 CLINICAL DATA:  Severe shoulder pain following seizure EXAM: CT OF THE UPPER RIGHT EXTREMITY WITHOUT CONTRAST TECHNIQUE: Multidetector CT imaging of the upper right extremity was performed according to the standard protocol. COMPARISON:  X-ray 11/22/2019 FINDINGS: Bones/Joint/Cartilage Posterior dislocation of the  humeral head relative to the glenoid. The anteromedial aspect of the humeral head remains perched upon the posterior glenoid rim with a large reverse Hill-Sachs impaction fracture. Large comminuted fracture fragment is displaced anteroinferiorly into the axillary pouch measuring 3.2 x 1.6 x 2.7 cm. Probable nondisplaced impaction fracture of the posterior glenoid rim (series 3, image 42). Large complex glenohumeral joint effusion with a small amount of fat density in the non dependent portion compatible with a lipohemarthrosis. AC joint is intact with mild degenerative changes. Ligaments Suboptimally assessed by CT. Muscles and Tendons Rotator cuff tendons are poorly evaluated although a injury to the anterior aspect of the supraspinatus tendon is suspected (series 9, image 55). Preserved rotator cuff muscle bulk. Soft tissues Extensive induration within the adjacent soft tissues without a well-defined fluid collection or hematoma. No axillary lymphadenopathy.  Visualized portion of the right lung is clear. IMPRESSION: 1. Posterior dislocation of the humeral head relative to the glenoid with a large reverse Hill-Sachs impaction fracture. Large comminuted fracture fragment is displaced anteroinferiorly into the axillary pouch measuring 3.2 x 1.6 x 2.7 cm. 2. Probable nondisplaced impaction fracture of the posterior glenoid rim. 3. Rotator cuff tendons are poorly evaluated although an injury to the anterior aspect of the distal supraspinatus tendon is suspected. 4. Large glenohumeral joint lipohemarthrosis. Electronically Signed   By: Davina Poke D.O.   On: 11/23/2019 10:33   DG CHEST PORT 1 VIEW  Result Date: 11/22/2019 CLINICAL DATA:  Unresponsive EXAM: PORTABLE CHEST 1 VIEW COMPARISON:  08/14/2015 FINDINGS: No focal airspace disease or effusion. Stable cardiomediastinal silhouette with aortic atherosclerosis. No pneumothorax. No radiopaque foreign body visualized. IMPRESSION: No active disease. Electronically Signed   By: Donavan Foil M.D.   On: 11/22/2019 17:40   DG Shoulder Right Port  Result Date: 11/24/2019 CLINICAL DATA:  Right shoulder surgery. EXAM: PORTABLE RIGHT SHOULDER COMPARISON:  CT 11/22/2018. FINDINGS: Total right shoulder replacement. Hardware intact. Anatomic alignment. IMPRESSION: Total right shoulder replacement with anatomic alignment. Electronically Signed   By: Marcello Moores  Register   On: 11/24/2019 17:11    Microbiology: Recent Results (from the past 240 hour(s))  SARS CORONAVIRUS 2 (TAT 6-24 HRS) Nasopharyngeal Nasopharyngeal Swab     Status: None   Collection Time: 11/22/19  2:52 AM   Specimen: Nasopharyngeal Swab  Result Value Ref Range Status   SARS Coronavirus 2 NEGATIVE NEGATIVE Final    Comment: (NOTE) SARS-CoV-2 target nucleic acids are NOT DETECTED. The SARS-CoV-2 RNA is generally detectable in upper and lower respiratory specimens during the acute phase of infection. Negative results do not preclude SARS-CoV-2  infection, do not rule out co-infections with other pathogens, and should not be used as the sole basis for treatment or other patient management decisions. Negative results must be combined with clinical observations, patient history, and epidemiological information. The expected result is Negative. Fact Sheet for Patients: SugarRoll.be Fact Sheet for Healthcare Providers: https://www.woods-mathews.com/ This test is not yet approved or cleared by the [place] FDA and  has been authorized for detection and/or diagnosis of SARS-CoV-2 by FDA under an Emergency Use Authorization (EUA). This EUA will remain  in effect (meaning this test can be used) for the duration of the COVID-19 declaration under Section 56 4(b)(1) of the Act, 21 U.S.C. section 360bbb-3(b)(1), unless the authorization is terminated or revoked sooner. Performed at [facility], [place] [address] [place], [place] [address]   Surgical pcr screen     Status: None   Collection Time: 11/24/19 12:20 AM  Specimen: Nasal Mucosa; Nasal Swab  Result Value Ref Range Status   MRSA, PCR NEGATIVE NEGATIVE Final   Staphylococcus aureus NEGATIVE NEGATIVE Final    Comment: (NOTE) The Xpert SA Assay (FDA approved for NASAL specimens in patients 46 years of age and older), is one component of a comprehensive surveillance program. It is not intended to diagnose infection nor to guide or monitor treatment. Performed at [facility], [address], [place], [place] [address]      Labs: Basic Metabolic Panel: Recent Labs  Lab 11/22/19 0109 11/23/19 0213 11/25/19 0327  NA 139 138 138  K 4.4 3.7 4.5  CL 103 102 100  CO2 17* 25 25  GLUCOSE 190* 119* 121*  BUN 13 11 12   CREATININE 1.18 0.98 0.96  CALCIUM 8.4* 8.3* 7.9*   Liver Function Tests: Recent Labs  Lab 11/22/19 0109 11/23/19 0213  AST 44* 57*  ALT 19 25  ALKPHOS 54 49  BILITOT 0.9 1.2  PROT 7.8 7.5    ALBUMIN 3.8 3.7   No results for input(s): LIPASE, AMYLASE in the last 168 hours. No results for input(s): AMMONIA in the last 168 hours. CBC: Recent Labs  Lab 11/22/19 0109 11/23/19 0213 11/25/19 0327  WBC 15.5* 9.2 8.1  NEUTROABS 8.8* 5.9 6.1  HGB 12.8* 12.2* 11.4*  HCT 41.0 38.1* 35.7*  MCV 87.2 83.9 84.6  PLT 281 229 220   Cardiac Enzymes: No results for input(s): CKTOTAL, CKMB, CKMBINDEX, TROPONINI in the last 168 hours. BNP: BNP (last 3 results) No results for input(s): BNP in the last 8760 hours.  ProBNP (last 3 results) No results for input(s): PROBNP in the last 8760 hours.  CBG: Recent Labs  Lab 11/25/19 1941 11/25/19 2310 11/26/19 0254 11/26/19 0851 11/26/19 1143  GLUCAP 138* 112* 107* 199* 80    Principal Problem:   First time seizure (HCC) Active Problems:   Hyperglycemia   Leukocytosis   Shoulder fracture, right   Time coordinating discharge: 38 minutes  Signed:        Juston Goheen, DO [facility]  11/26/2019, 4:18 PM

## 2019-11-26 NOTE — Progress Notes (Signed)
Occupational Therapy Treatment Patient Details Name: Joe Bell MRN: [id number] DOB: 1961-04-17 Today's Date: 11/26/2019    History of present illness 59 y/o male admitted yesterday with his first tonic-clonic seizure. This resulted in R humeral head fx s/p  R reverse TSA and dislocation treatment.   OT comments  Pt. Seen for skilled OT treatment session.  Pt. Able to return demo of HEP R d/w/e.  Also reviewed ub bathing and dressing compensatory strategies and following nwb precautions of RUE.  Pt. Verbalized understanding.  Note d/c home later today.    Follow Up Recommendations  Follow surgeon's recommendation for DC plan and follow-up therapies;Outpatient OT;Supervision - Intermittent    Equipment Recommendations  None recommended by OT    Recommendations for Other Services      Precautions / Restrictions Precautions Precautions: Shoulder Shoulder Interventions: Roe Coombs joy ultra sling;At all times Restrictions RUE Weight Bearing: Non weight bearing       Mobility Bed Mobility                  Transfers                      Balance                                           ADL either performed or assessed with clinical judgement   ADL Overall ADL's : Needs assistance/impaired         Upper Body Bathing: Adhering to UE precautions;Cueing for compensatory techniques;Cueing for sequencing Upper Body Bathing Details (indicate cue type and reason): reviewed tech. for UB dressing with use of compensatory technigues, one arm placing washcloth under left arm pit rubbing back and forth, verbalized understanding     Upper Body Dressing : Adhering to UE precautions;Cueing for UE precautions;Cueing for sequencing;Cueing for compensatory techniques Upper Body Dressing Details (indicate cue type and reason): reviewed donning and positioniong of sling. pt. able to point and gesture which velcro and clip went where.  also reviewed wearing sch. and how  to don shirt                         Vision       Perception     Praxis      Cognition Arousal/Alertness: Awake/alert Behavior During Therapy: WFL for tasks assessed/performed Overall Cognitive Status: Within Functional Limits for tasks assessed                                          Exercises Shoulder Exercises Elbow Flexion: 5 reps;Right;Seated;AROM Elbow Extension: AROM;Right;5 reps;Seated Wrist Flexion: AROM;Right;5 reps;Seated Wrist Extension: AROM;Right;5 reps;Seated Digit Composite Flexion: AROM;Right;5 reps;Seated Composite Extension: AROM;Right;5 reps;Seated   Shoulder Instructions       General Comments      Pertinent Vitals/ Pain       Pain Assessment: Faces Faces Pain Scale: Hurts a little bit Pain Location: Rt shoulder Pain Descriptors / Indicators: Aching;Sore  Home Living                                          Prior Functioning/Environment  Frequency           Progress Toward Goals  OT Goals(current goals can now be found in the care plan section)  Progress towards OT goals: Progressing toward goals     Plan      Co-evaluation                 AM-PAC OT "6 Clicks" Daily Activity     Outcome Measure   Help from another person eating meals?: None Help from another person taking care of personal grooming?: None Help from another person toileting, which includes using toliet, bedpan, or urinal?: A Little Help from another person bathing (including washing, rinsing, drying)?: A Little Help from another person to put on and taking off regular upper body clothing?: A Little Help from another person to put on and taking off regular lower body clothing?: A Little 6 Click Score: 20    End of Session    OT Visit Diagnosis: Other abnormalities of gait and mobility (R26.89);Other symptoms and signs involving the nervous system (R29.898);Pain Pain - Right/Left:  Right Pain - part of body: Shoulder   Activity Tolerance Patient tolerated treatment well   Patient Left in bed;with call bell/phone within reach;with bed alarm set   Nurse Communication          Time: 1610-9604 OT Time Calculation (min): 8 min  Charges: OT General Charges $OT Visit: 1 Visit OT Treatments $Therapeutic Exercise: 8-22 mins  Sonia Baller, Gem   Janice Coffin 11/26/2019, 12:32 PM

## 2019-11-26 NOTE — Progress Notes (Signed)
Received referral to assist pt with DME. PT is recommending a cane. Met with pt at bedside. He reports that he is returning home and he has family support. Informed him that he can buy a cane at [place] or a drug store. He verbalized understanding.

## 2019-11-26 NOTE — Progress Notes (Signed)
Discharged to home after IV access removed and discharge instructions given to pt and interpreter friend.  All questions answered.  No signs of distress.  No pain.

## 2019-11-26 NOTE — Progress Notes (Addendum)
     Subjective: 2 Days Post-Op s/p Procedure(s): SHOULDER HEMI-ARTHROPLASTY REVERSE SHOULDER ARTHROPLASTY   Patient is alert, oriented, laying comfortably in bed. Patient reports pain in shoulder to be mild. Denies nausea/vomiting. No other complaints.  Objective:  PE: VITALS:   Vitals:   11/25/19 2045 11/25/19 2312 11/26/19 0255 11/26/19 0737  BP: (!) 141/97 130/84 131/82 (!) 131/94  Pulse: 92 80 82 84  Resp:  16 17 16   Temp: 98.5 F (36.9 C) 98.2 F (36.8 C) 99.2 F (37.3 C) 98.7 F (37.1 C)  TempSrc: Oral  Oral   SpO2: 96% 95% 95% 98%  Weight:      Height:       General: Laying comfortably in bed on left side.  Abd: Abdomen soft and non-tender MSK: RUE in sling immobilizer. Able to move all fingers of right hand. Right hand warm and well perfused. Distal sensation intact. Dressing clean and dry, one spot of strike through.    LABS  Results for orders placed or performed during the hospital encounter of 11/22/19 (from the past 24 hour(s))  Glucose, capillary     Status: Abnormal   Collection Time: 11/25/19  8:36 AM  Result Value Ref Range   Glucose-Capillary 108 (H) 70 - 99 mg/dL  Glucose, capillary     Status: Abnormal   Collection Time: 11/25/19 12:06 PM  Result Value Ref Range   Glucose-Capillary 156 (H) 70 - 99 mg/dL  Glucose, capillary     Status: Abnormal   Collection Time: 11/25/19  3:55 PM  Result Value Ref Range   Glucose-Capillary 107 (H) 70 - 99 mg/dL  Glucose, capillary     Status: Abnormal   Collection Time: 11/25/19  7:41 PM  Result Value Ref Range   Glucose-Capillary 138 (H) 70 - 99 mg/dL  Glucose, capillary     Status: Abnormal   Collection Time: 11/25/19 11:10 PM  Result Value Ref Range   Glucose-Capillary 112 (H) 70 - 99 mg/dL   Comment 1 Notify RN    Comment 2 Document in Chart   Glucose, capillary     Status: Abnormal   Collection Time: 11/26/19  2:54 AM  Result Value Ref Range   Glucose-Capillary 107 (H) 70 - 99 mg/dL    DG  Shoulder Right Port  Result Date: 11/24/2019 CLINICAL DATA:  Right shoulder surgery. EXAM: PORTABLE RIGHT SHOULDER COMPARISON:  CT 11/22/2018. FINDINGS: Total right shoulder replacement. Hardware intact. Anatomic alignment. IMPRESSION: Total right shoulder replacement with anatomic alignment. Electronically Signed   By: 11/24/2018  Register   On: 11/24/2019 17:11    Assessment/Plan: Principal Problem:   First time seizure (HCC) Active Problems:   Hyperglycemia   Leukocytosis   Shoulder fracture, right    2 Days Post-Op s/p Procedure(s): SHOULDER HEMI-ARTHROPLASTY REVERSE SHOULDER ARTHROPLASTY  Weightbearing: non-weightbearing, RUE in sling immobilizer Insicional and dressing care: Patient to continue using operative dressing until follow-up with Dr. 11/26/2019 in one week.  VTE prophylaxis: Not indicated due to isolated upper extremity surgery with no specific risk factors Pain control: continue Prn pain control until discharge, will discharge with pain medication Follow - up plan: Follow up with Dr. Everardo Pacific in one week for incision check and x-ray. Physical therapy to start at 2 weeks. Ok for discharge today from ortho perspective   Everardo Pacific 11/26/2019, 8:06 AM

## 2019-11-27 ENCOUNTER — Encounter: Payer: Self-pay | Admitting: *Deleted

## 2021-05-24 ENCOUNTER — Emergency Department (HOSPITAL_COMMUNITY): Payer: Self-pay

## 2021-05-24 ENCOUNTER — Observation Stay (HOSPITAL_COMMUNITY): Payer: Self-pay

## 2021-05-24 ENCOUNTER — Inpatient Hospital Stay (HOSPITAL_COMMUNITY)
Admission: EM | Admit: 2021-05-24 | Discharge: 2021-05-27 | DRG: 101 | Disposition: A | Discharge status: Home / Self Care | Payer: Self-pay | Attending: Internal Medicine | Admitting: Internal Medicine

## 2021-05-24 ENCOUNTER — Other Ambulatory Visit: Payer: Self-pay

## 2021-05-24 DIAGNOSIS — R222 Localized swelling, mass and lump, trunk: Secondary | ICD-10-CM | POA: Diagnosis present

## 2021-05-24 DIAGNOSIS — Z87898 Personal history of other specified conditions: Secondary | ICD-10-CM

## 2021-05-24 DIAGNOSIS — X58XXXA Exposure to other specified factors, initial encounter: Secondary | ICD-10-CM | POA: Diagnosis present

## 2021-05-24 DIAGNOSIS — R4182 Altered mental status, unspecified: Secondary | ICD-10-CM | POA: Diagnosis present

## 2021-05-24 DIAGNOSIS — S42291A Other displaced fracture of upper end of right humerus, initial encounter for closed fracture: Secondary | ICD-10-CM | POA: Diagnosis present

## 2021-05-24 DIAGNOSIS — M25511 Pain in right shoulder: Secondary | ICD-10-CM

## 2021-05-24 DIAGNOSIS — R569 Unspecified convulsions: Principal | ICD-10-CM | POA: Diagnosis present

## 2021-05-24 DIAGNOSIS — S01512A Laceration without foreign body of oral cavity, initial encounter: Secondary | ICD-10-CM | POA: Diagnosis present

## 2021-05-24 DIAGNOSIS — Z96611 Presence of right artificial shoulder joint: Secondary | ICD-10-CM | POA: Diagnosis present

## 2021-05-24 DIAGNOSIS — J9859 Other diseases of mediastinum, not elsewhere classified: Secondary | ICD-10-CM

## 2021-05-24 DIAGNOSIS — Z20822 Contact with and (suspected) exposure to covid-19: Secondary | ICD-10-CM | POA: Diagnosis present

## 2021-05-24 DIAGNOSIS — R748 Abnormal levels of other serum enzymes: Secondary | ICD-10-CM | POA: Diagnosis present

## 2021-05-24 DIAGNOSIS — R7989 Other specified abnormal findings of blood chemistry: Secondary | ICD-10-CM | POA: Diagnosis present

## 2021-05-24 LAB — CBC WITH DIFFERENTIAL/PLATELET
Abs Immature Granulocytes: 0.05 10*3/uL (ref 0.00–0.07)
Basophils Absolute: 0 10*3/uL (ref 0.0–0.1)
Basophils Relative: 0 %
Eosinophils Absolute: 0 10*3/uL (ref 0.0–0.5)
Eosinophils Relative: 0 %
HCT: 38.4 % — ABNORMAL LOW (ref 39.0–52.0)
Hemoglobin: 12.4 g/dL — ABNORMAL LOW (ref 13.0–17.0)
Immature Granulocytes: 1 %
Lymphocytes Relative: 16 %
Lymphs Abs: 1.6 10*3/uL (ref 0.7–4.0)
MCH: 27.6 pg (ref 26.0–34.0)
MCHC: 32.3 g/dL (ref 30.0–36.0)
MCV: 85.5 fL (ref 80.0–100.0)
Monocytes Absolute: 0.6 10*3/uL (ref 0.1–1.0)
Monocytes Relative: 6 %
Neutro Abs: 8.2 10*3/uL — ABNORMAL HIGH (ref 1.7–7.7)
Neutrophils Relative %: 77 %
Platelets: 230 10*3/uL (ref 150–400)
RBC: 4.49 MIL/uL (ref 4.22–5.81)
RDW: 11.8 % (ref 11.5–15.5)
WBC: 10.5 10*3/uL (ref 4.0–10.5)
nRBC: 0 % (ref 0.0–0.2)

## 2021-05-24 LAB — COMPREHENSIVE METABOLIC PANEL
ALT: 23 U/L (ref 0–44)
AST: 30 U/L (ref 15–41)
Albumin: 3.7 g/dL (ref 3.5–5.0)
Alkaline Phosphatase: 46 U/L (ref 38–126)
Anion gap: 9 (ref 5–15)
BUN: 14 mg/dL (ref 8–23)
CO2: 23 mmol/L (ref 22–32)
Calcium: 8.4 mg/dL — ABNORMAL LOW (ref 8.9–10.3)
Chloride: 105 mmol/L (ref 98–111)
Creatinine, Ser: 1 mg/dL (ref 0.61–1.24)
GFR, Estimated: >60 mL/min (ref >60)
Glucose, Bld: 157 mg/dL — ABNORMAL HIGH (ref 70–99)
Potassium: 3.9 mmol/L (ref 3.5–5.1)
Sodium: 137 mmol/L (ref 135–145)
Total Bilirubin: 0.4 mg/dL (ref 0.3–1.2)
Total Protein: 7.1 g/dL (ref 6.5–8.1)

## 2021-05-24 LAB — URINALYSIS, ROUTINE W REFLEX MICROSCOPIC
Bilirubin Urine: NEGATIVE
Glucose, UA: NEGATIVE mg/dL
Hgb urine dipstick: NEGATIVE
Ketones, ur: NEGATIVE mg/dL
Leukocytes,Ua: NEGATIVE
Nitrite: NEGATIVE
Protein, ur: NEGATIVE mg/dL
Specific Gravity, Urine: 1.014 (ref 1.005–1.030)
pH: 5 (ref 5.0–8.0)

## 2021-05-24 LAB — HIV ANTIBODY (ROUTINE TESTING W REFLEX): HIV Screen 4th Generation wRfx: NONREACTIVE

## 2021-05-24 LAB — RAPID URINE DRUG SCREEN, HOSP PERFORMED
Amphetamines: NOT DETECTED
Barbiturates: NOT DETECTED
Benzodiazepines: NOT DETECTED
Cocaine: NOT DETECTED
Opiates: NOT DETECTED
Tetrahydrocannabinol: NOT DETECTED

## 2021-05-24 LAB — VITAMIN B12: Vitamin B-12: 691 pg/mL (ref 180–914)

## 2021-05-24 LAB — RESP PANEL BY RT-PCR (FLU A&B, COVID) ARPGX2
Influenza A by PCR: NEGATIVE
Influenza B by PCR: NEGATIVE
SARS Coronavirus 2 by RT PCR: NEGATIVE

## 2021-05-24 LAB — I-STAT VENOUS BLOOD GAS, ED
Acid-base deficit: 2 mmol/L (ref 0.0–2.0)
Bicarbonate: 24.6 mmol/L (ref 20.0–28.0)
Calcium, Ion: 1.1 mmol/L — ABNORMAL LOW (ref 1.15–1.40)
HCT: 36 % — ABNORMAL LOW (ref 39.0–52.0)
Hemoglobin: 12.2 g/dL — ABNORMAL LOW (ref 13.0–17.0)
O2 Saturation: 76 %
Potassium: 4 mmol/L (ref 3.5–5.1)
Sodium: 141 mmol/L (ref 135–145)
TCO2: 26 mmol/L (ref 22–32)
pCO2, Ven: 46.6 mmHg (ref 44.0–60.0)
pH, Ven: 7.331 (ref 7.250–7.430)
pO2, Ven: 44 mmHg (ref 32.0–45.0)

## 2021-05-24 LAB — TROPONIN I (HIGH SENSITIVITY)
Troponin I (High Sensitivity): 13 ng/L (ref <18)
Troponin I (High Sensitivity): 15 ng/L (ref <18)

## 2021-05-24 LAB — PROTIME-INR
INR: 1 (ref 0.8–1.2)
Prothrombin Time: 13.4 seconds (ref 11.4–15.2)

## 2021-05-24 LAB — LIPASE, BLOOD: Lipase: 26 U/L (ref 11–51)

## 2021-05-24 LAB — LACTIC ACID, PLASMA
Lactic Acid, Venous: 4.4 mmol/L (ref 0.5–1.9)
Lactic Acid, Venous: 4.1 mmol/L (ref 0.5–1.9)

## 2021-05-24 LAB — CK: Total CK: 697 U/L — ABNORMAL HIGH (ref 49–397)

## 2021-05-24 LAB — ETHANOL: Alcohol, Ethyl (B): <10 mg/dL (ref <10)

## 2021-05-24 LAB — BRAIN NATRIURETIC PEPTIDE: B Natriuretic Peptide: 8.2 pg/mL (ref 0.0–100.0)

## 2021-05-24 LAB — MAGNESIUM: Magnesium: 2.3 mg/dL (ref 1.7–2.4)

## 2021-05-24 LAB — AMMONIA: Ammonia: 35 umol/L (ref 9–35)

## 2021-05-24 LAB — PHOSPHORUS: Phosphorus: 2.6 mg/dL (ref 2.5–4.6)

## 2021-05-24 LAB — TSH: TSH: 0.746 u[IU]/mL (ref 0.350–4.500)

## 2021-05-24 MED ORDER — ACETAMINOPHEN 500 MG PO TABS
1000.0000 mg | ORAL_TABLET | Freq: Two times a day (BID) | ORAL | Status: DC
  Administered 2021-05-24 – 2021-05-27 (×6): 1000 mg via ORAL
  Filled 2021-05-24 (×6): qty 2

## 2021-05-24 MED ORDER — LEVETIRACETAM 500 MG PO TABS
500.0000 mg | ORAL_TABLET | Freq: Two times a day (BID) | ORAL | Status: DC
  Administered 2021-05-24 – 2021-05-27 (×7): 500 mg via ORAL
  Filled 2021-05-24 (×7): qty 1

## 2021-05-24 MED ORDER — ORAL CARE MOUTH RINSE
15.0000 mL | OROMUCOSAL | Status: DC
  Administered 2021-05-24 – 2021-05-27 (×25): 15 mL via OROMUCOSAL

## 2021-05-24 MED ORDER — LORAZEPAM 2 MG/ML IJ SOLN: 2.0000 mg | INTRAMUSCULAR | Status: DC | PRN

## 2021-05-24 MED ORDER — LEVETIRACETAM IN NACL 1000 MG/100ML IV SOLN
1000.0000 mg | Freq: Once | INTRAVENOUS | Status: AC
  Administered 2021-05-24: 1000 mg via INTRAVENOUS
  Filled 2021-05-24: qty 100

## 2021-05-24 MED ORDER — ACETAMINOPHEN 325 MG PO TABS: 650.0000 mg | ORAL_TABLET | Freq: Four times a day (QID) | ORAL | Status: DC | PRN

## 2021-05-24 MED ORDER — ENOXAPARIN SODIUM 40 MG/0.4ML IJ SOSY
40.0000 mg | PREFILLED_SYRINGE | INTRAMUSCULAR | Status: DC
  Administered 2021-05-24 – 2021-05-26 (×3): 40 mg via SUBCUTANEOUS
  Filled 2021-05-24 (×3): qty 0.4

## 2021-05-24 MED ORDER — CHLORHEXIDINE GLUCONATE 0.12% ORAL RINSE (MEDLINE KIT)
15.0000 mL | Freq: Two times a day (BID) | OROMUCOSAL | Status: DC
  Administered 2021-05-24 – 2021-05-27 (×6): 15 mL via OROMUCOSAL

## 2021-05-24 MED ORDER — SODIUM CHLORIDE 0.9 % IV SOLN
75.0000 mL/h | INTRAVENOUS | Status: DC
  Administered 2021-05-24 – 2021-05-27 (×4): 75 mL/h via INTRAVENOUS

## 2021-05-24 MED ORDER — ACETAMINOPHEN 650 MG RE SUPP: 650.0000 mg | Freq: Two times a day (BID) | RECTAL | Status: DC

## 2021-05-24 MED ORDER — ACETAMINOPHEN 650 MG RE SUPP: 650.0000 mg | Freq: Four times a day (QID) | RECTAL | Status: DC | PRN

## 2021-05-24 MED ORDER — LACTATED RINGERS IV BOLUS
1000.0000 mL | Freq: Once | INTRAVENOUS | Status: AC
  Administered 2021-05-24: 1000 mL via INTRAVENOUS

## 2021-05-24 NOTE — H&P (Addendum)
Date: 05/24/2021               Patient Name:  Shamarr Faucett MRN: [id number]  DOB: [birth year] Age / Sex: 60 y.o., male   PCP: Pcp, No         Medical Service: Internal Medicine Teaching Service         Attending Physician: Dr. Inez Catalina, MD    First Contact: Dr. Carmel Sacramento Pager: [phone]  Second Contact: Dr. Merrilyn Puma Pager: [phone]       After Hours (After 5p/  First Contact Pager: [phone]  weekends / holidays): Second Contact Pager: [phone]   Chief Complaint: AMS   History of Present Illness:  Hx is obtained from chart review and pt's housemate Sherlean Foot Adamo), who also provided translation when interviewing the pt. Antino Mayabb is a 60 yo Hausa speaking M from [place] with a PMHx of a seizure about one year ago, brought to ED due to lethargy, confusion, and a witnessed seizure-like episode. Patient is drowsy, but complains of a headache and 9/10 right shoulder pain. He remembers sleeping and then waking up at the hospital, not recalling events leading up to this. Per friend, pt was in his usual state this AM when they prayed, pt then went back to sleep for about 30 minutes, and when he woke up he felt like he was going to fall. He was advised to rest. Later, a different housemate [facility]) went to check on pt because it sounded like he was breathing abnormally, and found the pt having "shaking" activity with noted stiff arms and unresponsive, which Soumaila thinks was ongoing for nearly 30 minutes but not entirely sure about the details. Soumaila states that something very similar happened during his previous isolated seizure. Pt did not fall. No apparent injury except right lateral tongue laceration. No urinary incontinence noted.  Per chart review, one time, first time seizure with a negative workup with EEG and imaging last year, 11/22/19. Sustained a right humeral head fx and underwent a right reverse shoulder arthroplasty. Not sent on AEDs. Pt did not follow with neurology  and reports no seizure type activity since then.  ED course: Vitals stable  EKG- sinus rhythm  Lactic acid 4.4 -> 4.1, WBC 10.5  Trops 13 -> 15, BNP 8 VBG wnl Ethanol <10  Lipase 26  CT head show no acute intracranial abnormalities, CXR shows superior mediastinal soft tissue fullness, possibly from AP portable technique.  Neurololgy consulted, loaded with Keppra.   Meds:  None No outpatient medications have been marked as taking for the 05/24/21 encounter [facility] Encounter).    Allergies: Allergies as of 05/24/2021   (Not on File)   KNDA   Family History:  Mother and father- lived healthy lives until their late 51s to 17s. No FH of seizures, diabetes, HTN, HLD, or cancer.   Social History:  Lives with 2 roommates in 3 bedroom home. Came to [place] in 2001. His family is back home in [place], [place].  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  IADLs/ADLs- can person independently at baseline   Review of Systems: A complete ROS was negative except as per HPI.   Physical Exam: Blood pressure 113/65, pulse (!) 57, temperature 98 F (36.7 C), temperature source Oral, resp. rate 15, height 5\' 4"  (1.626 m), weight 90.7 kg, SpO2 98 %.  Constitutional: sleepy, well-appearing, in no acute distress HENT:  normocephalic, atraumatic, mucous membranes moist, laceration on right lateral side of tongue with some bleeding.  Eyes: conjunctiva non-erythematous, extraocular movements intact Cardiovascular: regular rate and rhythm, no m/r/g Pulmonary/Chest: normal work of breathing on room air, lungs clear to auscultation bilaterally, no focal tenderness with chest palpation. Abdominal: soft, non-tender to palpation, non-distended MSK: normal bulk and tone, tenderness to palpation of right shoulder Neurological: alert & oriented x 3, 5/5 strength in bilateral extremities Skin: warm and dry Extremities: no swelling or  pitting edema  Psych: normal behavior, normal affect  EKG: personally reviewed my interpretation is sinus rhythm   CXR: personally reviewed my interpretation is superior mediastinal soft tissue fullness, possibly from AP portable technique.  Assessment & Plan by Problem: Principal Problem:   Altered mental status Active Problems:   History of seizure  Abishai Viegas is a 60 yo Hausa speaking M from [place] with a PMHx of a seizure about one year ago, brought to ED due to lethargy, confusion, and a witnessed seizure-like episode.    Seizure AMS Right shoulder pain  Pt with hx of first time seizure 1 year ago, presenting for lethargy and witnessed tonic-clonic seizure-like activity. Workup x 1 year ago showing no structural abnormalities and negative EEG. CT today shows no structural abnormalities. Neurology consulted, loaded with Keppra. MRI and EEG pending. Vitals stable, elevated lactic acid 4.4 -> 4.1 likely from seizure activity, UDS pending, no electrolyte derangement. Alcohol withdrawal seizure unlikely given pt denies use. Pt did not have a fall, but complains of 9/10 right shoulder pain. Benign physical exam findings, except for right lateral tongue laceration. Confusion is likely post-ictal state. Seizure 1 year ago resulted in R humeral head fx, tx with right reverse shoulder arthroplasty.  -Per neurology, Keppra 500mg  po q12 hours and continue at discharge and Ativan 2mg  IV prn seizure lasting over 5 minutes and notify neurology. -EEG pending  -MRI brain pending  -CK pending, started NS 77 cc/hr  -R shoulder X ray pending  -Vitamin B12 level pending, replete if <500.  -Inpatient seizure precautions -Out patient neurology f/up appt in 2-4 weeks, referral placed.  -Will inform that pt's with seizures are not allowed to drive until they have been seizure-free for six months -Fluids  -NPO status pending SLP  Best Practice: Diet: NPO pending SLP IVF: None,None VTE: Levenox 40   Code: Full   Signature: , MD  Internal Medicine Resident, PGY-1 Internal Medicine Residency  Pager: [phone] 3:55 PM, 05/24/2021  After 5pm on weekdays and 1pm on weekends: On Call pager: [phone]

## 2021-05-24 NOTE — ED Provider Notes (Signed)
[facility] EMERGENCY DEPARTMENT Provider Note   CSN: [id number] Arrival date & time: 05/24/21  5027     History Chief Complaint  Patient presents with   Altered Mental Status    Joe Bell is a 60 y.o. male.  HPI History is somewhat limited at this time.  History comes from EMS and small amount of history from the patient.  Per EMS family reports the patient was lethargic this morning and not himself.  There was no report of injury or fall.  They did not report observing seizure activity.  Reportedly, the patient was normal yesterday evening.  On EMS arrival the patient was in bed and reportedly he had not been out of bed yet although he does have on pants and looks dressed.  CBG was normal.  Patient was not hypotensive.  Patient was breathing spontaneously protecting airway.  No interventions needed.  The patient cannot describe what happened and does not seem to have recall for events leading up to the emergency department.  He is very vague and drowsy.  When asked about pain he indicates his epigastrium is uncomfortable.  He is denying a headache.  Review of EMR indicates similar presentation with diagnosis of seizure.  Family had reported to EMS that patient had 1 prior episode of similar symptoms.    No past medical history on file.  There are no problems to display for this patient.        No family history on file.     Home Medications Prior to Admission medications   Not on File    Allergies    Patient has no allergy information on record.  Review of Systems   Review of Systems Level 5 caveat cannot obtain review of systems due to patient condition/confusion Physical Exam Updated Vital Signs BP 111/65 (BP Location: Right Arm)   Pulse 89   Temp 98 F (36.7 C) (Oral)   Resp 16   Ht 5\' 4"  (1.626 m)   Wt 90.7 kg   SpO2 100%   BMI 34.33 kg/m   Physical Exam Constitutional:      Comments: Patient seems lethargic.  He is not exhibiting  any respiratory distress.  He can assist in following commands but seems very very drowsy.   HENT:     Head: Normocephalic and atraumatic.     Mouth/Throat:     Mouth: Mucous membranes are moist.     Pharynx: Oropharynx is clear.  Eyes:     Extraocular Movements: Extraocular movements intact.     Pupils: Pupils are equal, round, and reactive to light.     Comments: Pupils 2 to 3 mm and symmetric responsive  Cardiovascular:     Rate and Rhythm: Normal rate and regular rhythm.  Pulmonary:     Effort: Pulmonary effort is normal.     Breath sounds: Normal breath sounds.  Abdominal:     General: There is no distension.     Palpations: Abdomen is soft.     Tenderness: There is no abdominal tenderness. There is no guarding.  Musculoskeletal:        General: No swelling or tenderness. Normal range of motion.     Right lower leg: No edema.     Left lower leg: No edema.  Skin:    General: Skin is warm and dry.  Neurological:     Comments: Patient is very drowsy but will answer questions.  Combination of drowsiness plus accent makes him difficult to understand  but speech is not specifically slurred and patient does not seem to have a receptive or expressive aphasia.  He will follow commands to do grip strength bilaterally which is symmetric.  He will also follow commands for evaluating pronator drift with nonpresent.  Patient will at command elevate each leg independently off of the bed and hold for 10 seconds.    ED Results / Procedures / Treatments   Labs (all labs ordered are listed, but only abnormal results are displayed) Labs Reviewed  RESP PANEL BY RT-PCR (FLU A&B, COVID) [id number]  COMPREHENSIVE METABOLIC PANEL  ETHANOL  LIPASE, BLOOD  BRAIN NATRIURETIC PEPTIDE  LACTIC ACID, PLASMA  LACTIC ACID, PLASMA  CBC WITH DIFFERENTIAL/PLATELET  PROTIME-INR  URINALYSIS, ROUTINE W REFLEX MICROSCOPIC  RAPID URINE DRUG SCREEN, HOSP PERFORMED  BLOOD GAS, VENOUS  AMMONIA  MAGNESIUM   PHOSPHORUS  TSH  TROPONIN I (HIGH SENSITIVITY)    EKG EKG Interpretation  Date/Time:  Saturday May 24 2021 08:54:00 EDT Ventricular Rate:  66 PR Interval:  161 QRS Duration: 98 QT Interval:  420 QTC Calculation: 440 R Axis:   -30 Text Interpretation: Sinus rhythm Left axis deviation Posterior infarct, old Borderline T abnormalities, diffuse leads no STEMI. nonspecific inferior lateral t wave invesion Confirmed by Arby Barrette [phone]) on 05/24/2021 9:30:42 AM  Radiology No results found.  Procedures Procedures   Medications Ordered in ED Medications - No data to display  ED Course  I have reviewed the triage vital signs and the nursing notes.  Pertinent labs & imaging results that were available during my care of the patient were reviewed by me and considered in my medical decision making (see chart for details).    MDM Rules/Calculators/A&P                          Consult: Internal medicine teaching service for admission  Consult: Dr. Amada Jupiter neurology.  Recommend starting Keppra bolus and will order EEG with planned admission for persistent mental status change  Patient presents as outlined.  He had a similar presentation and was diagnosed with a seizure.  At this time, no additional history to suggest witnessed seizure.  History from EMS is that family members or friends found him to be lethargic but no report of tonic-clonic activity.  Patient is neurologic exam is nonfocal.  He does not have any focal motor deficits.  He can follow commands although his fairly sleepy and has to be directed.  Patient does not show signs of being septic.  Blood pressure temperature heart rate and respiratory status are all normal.  At this time we will proceed with further diagnostic evaluation with EEG, Keppra for treatment and admission to medical service for further monitoring and diagnostic evaluation as needed.  Final Clinical Impression(s) / ED Diagnoses Final diagnoses:   Altered mental status, unspecified altered mental status type    Rx / DC Orders ED Discharge Orders     None        Arby Barrette, MD 05/24/21 1329

## 2021-05-24 NOTE — ED Notes (Signed)
Transported to CT 

## 2021-05-24 NOTE — Progress Notes (Addendum)
EEG complete - results pending 

## 2021-05-24 NOTE — Progress Notes (Signed)
   Subjective: No acute overnight events. Patient was seen at bedside during rounds this morning. Pt reports feeling well this AM. Pt denies shoulder pain, headache, chest pain, chest fullness, and confusion. No other complains or concerns at this time.   Objective:  Vital signs in last 24 hours: Vitals:   05/24/21 1300 05/24/21 1345 05/24/21 1715 05/24/21 1830  BP: 127/89 113/65 133/84 104/89  Pulse: 61 (!) 57 63 60  Resp: 15 15 19 16   Temp:    98.1 F (36.7 C)  TempSrc:    Oral  SpO2: 100% 98% 99% 100%  Weight:      Height:       Constitutional: sleepy, well-appearing, in no acute distress HENT: normocephalic, atraumatic, mucous membranes moist, laceration on right lateral side of tongue healing.  Eyes: conjunctiva non-erythematous, extraocular movements intact Cardiovascular: regular rate and rhythm, no m/r/g Pulmonary/Chest: normal work of breathing on room air, lungs clear to auscultation bilaterally, no focal tenderness with chest palpation. Abdominal: soft, non-tender to palpation, non-distended MSK: normal bulk and tone, right shoulder not tender to palpation Neurological: alert & oriented x 3, 5/5 strength in bilateral extremities Skin: warm and dry Extremities: no swelling or pitting edema  Psych: normal behavior, normal affect   Assessment/Plan:  Principal Problem:   Altered mental status Active Problems:   History of seizure  Joe Bell is a 60 yo [place]a speaking M from 66 with a PMHx of a seizure about one year ago, brought to ED due to lethargy, confusion, and a witnessed seizure-like episode.    Seizure AMS Right shoulder pain  Mediastinal fullness on CXR Pt with hx of first time seizure 1 year ago (workup negative), presented during post ictal state after a witnessed seizure. CT today shows no structural abnormalities. Neurology consulted, loaded with Keppra. EEG normal, but does not r/o epilepsy. MRI pending. Elevated lactic acid on presentation (4.4,  4.1) likely from seizure activity, now resolved 0.8. UDS negative, no electrolyte derangement, Vit B12 wnl. Not likely alcohol withdrawal seizures, as pt denies current or previous EtOH use. CK 697 -> 872, will trend. Pt did not have a fall, but complained of right shoulder pain on presentation, XR shows small bone fragment along the inferior glenoid that could reflect small avulsed fragment. He does not c/o shoulder pain today. Benign exam findings, patient appears well and is interactive. CXR shows superior mediastinal fullness, will reevaluate with 2 view film.  -Per neurology, Keppra 500mg  po q12 hours and continue at discharge, and Ativan 2mg  IV prn seizure lasting over 5 minutes. Neurology following, will f/u on recommendations. Informed that pts with seizures are not allowed to drive until they have been seizure-free for six months, he understands and does not anticipate this being an issue.  -trend CK -Gave 1L LR bolus and will evaluate fluid status.  -f/u on chest 2 view for mediastinal abnormality -Inpatient seizure precautions -Out patient neurology f/up appt in 2-4 weeks, referral already placed.    Best Practice: Diet: regular IVF: None,None VTE: Levenox 40  Code: Full  Signature: [place], MD  Internal Medicine Resident, PGY-1 Internal Medicine Residency  Pager: [phone] After 5pm on weekdays and 1pm on weekends: On Call pager [phone]

## 2021-05-24 NOTE — ED Notes (Signed)
Got patient undressed on the monitor did ekg shown to Dr Shanna Cisco patient is resting with call bell in reach

## 2021-05-24 NOTE — Consult Note (Addendum)
Neurology Consultation NOTE: Patient also has a chart marked for merge MRN [id number]  Reason for Consult: seizure like activity.  Referring Physician: Stanton Kidney., MD.   CC: seizure like activity.   History is obtained from: chart, friend who saw event today.     HPI: Joe Bell is a 60 yo Hausa speaking male from [place] with a PMHx of a seizure about one year ago and no other history. Patient presented to ED via EMS due to lethargy this morning. 1st friend states he was not there from 0545 to around 0800 hours, but saw patient at 0800 with stiff arms and shaking lasting 10 mins. This recurred after a few minutes. Patient was unresponsive during episodes and his eyes were closed. Per friend, patient was still asleep when he was found with seizure activity. No fall or head trauma. Last thing patient remembers is saying prayers at bedtime 05/23/21.   Patient received 1gm Keppra IV x 1 in ED. EEG pending.   NP attempted thorough exam/history with use of 1st friend (roommate) at bedside. However, NP did not feel that thorough history was reliable, so another friend came to hospital to help [name]).   No history of of TBI, concussion, brain surgery, stroke, or brain bleed. No past history of staring spells. No recent illness, n/v, chest pain, SOB. Has a "moderate" dull HA to frontal area. He worked yesterday.   In chart review, NP reviewed events/notes from seizure visit last year 11/22/19. Patient had a one time, first time seizure with a negative workup with EEG and imaging. It was decided not to send patient home on AEDs. He has no followed with neurology and reports no seizure type activity since then. He fell with that seizure and sustained a right humeral head fx and underwent a right reverse shoulder arthroplasty.   Neurology was asked to consult due to seizure activity.   ROS: A robust ROS was performed and is negative except as noted in the HPI.   PMHx: First time seizure 2021.  Traumatic right humeral head fracture.   Surgical Hx: Right reverse shoulder arthroplasty.   Social history:  Married with 7 children. Family lives in [place]. Works at [employer] in [employer]. No history of tobacco use, ETOH use, or illicit drugs. Speaks Hausa. Came to [place] in 2001.   PTA medications:  none  Current medications:  Keppra 1 gm IV x 1. Lovenox.   Exam: Current vital signs: BP 113/65   Pulse (!) 57   Temp 98 F (36.7 C) (Oral)   Resp 15   Ht 5\' 4"  (1.626 m)   Wt 90.7 kg   SpO2 98%   BMI 34.33 kg/m  Vital signs in last 24 hours: Temp:  [97.9 F (36.6 C)-98 F (36.7 C)] 98 F (36.7 C) (08/20 0917) Pulse Rate:  [56-89] 57 (08/20 1345) Resp:  [12-20] 15 (08/20 1345) BP: (105-147)/(62-92) 113/65 (08/20 1345) SpO2:  [83 %-100 %] 98 % (08/20 1345) Weight:  [90.7 kg] 90.7 kg (08/20 0918)  PE: GENERAL: Well appearing male in NAD. Awake and alert. Resting on ED bed.  HEENT: - Normocephalic and atraumatic, moist mucous membranes. LUNGS - Normal respiratory effort.  CV - RRR. ABDOMEN - Soft, nontender. Ext: warm, well perfused. Psych: Affect appropriate to situation.  NEURO:  Mental Status: Awake, alert, and oriented to self, age, year, place. Month and date do not translate to 09-09-2002.  Speech/Language: speech is without dysarthria or aphasia. Naming, repetition, fluency, and comprehension intact. Cranial Nerves:  II: PERRL 15mm/brisk. visual fields full. III, IV, VI: EOMI. Lid elevation symmetric and full.  V: sensation is intact and symmetrical to face.  VII: Smile is symmetrical. Able to puff cheeks and raise eyebrows.  VIII:hearing intact to voice. IX, X: palate elevation is symmetric. Phonation normal.  XI: normal sternocleidomastoid and trapezius muscle strength. BTD:VVOHYW is symmetrical without fasciculations.   Motor:  5/5 strength to all muscle groups tested.  Tone is normal. Bulk is normal.  Sensation- Intact to light touch bilaterally in all  four extremities. Extinction absent to DSS.  Coordination: FTN intact bilaterally. No drift.  DTRs: 2+ throughout.  Cerebellar: No tremor, shaking, twitching, or clonus. Gait: deferred.  Labs TSH 0.746   LA 4.4 (probably secondary to seizure)  INR 1    Gluc 157   ETOH level < 10.  CBC    Component Value Date/Time   WBC 10.5 05/24/2021 0923   RBC 4.49 05/24/2021 0923   HGB 12.2 (L) 05/24/2021 0943   HCT 36.0 (L) 05/24/2021 0943   PLT 230 05/24/2021 0923   MCV 85.5 05/24/2021 0923   MCH 27.6 05/24/2021 0923   MCHC 32.3 05/24/2021 0923   RDW 11.8 05/24/2021 0923   LYMPHSABS 1.6 05/24/2021 0923   MONOABS 0.6 05/24/2021 0923   EOSABS 0.0 05/24/2021 0923   BASOSABS 0.0 05/24/2021 0923    CMP     Component Value Date/Time   NA 141 05/24/2021 0943   K 4.0 05/24/2021 0943   CL 105 05/24/2021 0923   CO2 23 05/24/2021 0923   GLUCOSE 157 (H) 05/24/2021 0923   BUN 14 05/24/2021 0923   CREATININE 1.00 05/24/2021 0923   CALCIUM 8.4 (L) 05/24/2021 0923   PROT 7.1 05/24/2021 0923   ALBUMIN 3.7 05/24/2021 0923   AST 30 05/24/2021 0923   ALT 23 05/24/2021 0923   ALKPHOS 46 05/24/2021 0923   BILITOT 0.4 05/24/2021 0923   GFRNONAA >60 05/24/2021 0923   Imaging  CT head No acute intracranial abnormality.  EEG: Pending.  Assessment: 60 yo male who presented via EMS for lethargy and seizure like activity at home. Last year he had a seizure, but was first time seizure and no structural abnormalities were found and EEG was negative, so patient's AEDs were not continued at discharge. Given today's witnessed seizure like activity would be his second seizure episode, patient will need to continue AEDs on discharge. No structural abnormalities on [facility] but will get MRI brain to further clarify. Awaiting formal EEG read. Patient has no history of anger issues, violence, suicidal ideations, or depression, so Keppra should be a good choice for him. He will need to stay in hospital until he is 24  hours seizure free.   Impression: -seizure.   Recommendations/Plan:  -MRI brain.  -Keppra 500mg  po q12 hours. Continue at discharge.  -check Vitamin B12 level and replete if less than 500.  -inpatient seizure precautions.  -Ativan 2mg  IV prn seizure lasting over 5 minutes and notify neurology.  -Out patient neurology f/up appt in 2-4 weeks, referral placed.   Per [facility] statutes, patients with seizures are not allowed to drive until they have been seizure-free for six months.   Use caution when using heavy equipment or power tools. Avoid working on ladders or at heights. Take showers instead of baths. Ensure the water temperature is not too high on the home water heater. Do not go swimming alone. Do not lock yourself in a room alone (i.e. bathroom). When caring  for infants or small children, sit down when holding, feeding, or changing them to minimize risk of injury to the child in the event you have a seizure. Maintain good sleep hygiene. Avoid alcohol.    If patient has another seizure, call 911 and bring them back to the ED if: A.  The seizure lasts longer than 5 minutes.      B.  The patient doesn't wake shortly after the seizure or has new problems such as difficulty seeing, speaking or moving following the seizure C.  The patient was injured during the seizure D.  The patient has a temperature over 102 F (39C) E.  The patient vomited during the seizure and now is having trouble breathing   Pt seen by Jimmye Norman, NP/Neuro and later by MD. Note/plan to be edited by MD as needed.  Pager: [phone]     I have seen the patient and reviewed the above note.  He has now had a second seizure, and appears to be improving.  He will need to be on antiepileptic therapy from here on out.  He has been started on Keppra and I think this is a reasonable choice.  Neurology will follow.  Ritta Slot, MD [facility] [phone]  If 7pm- 7am, please  page neurology on call as listed in AMION.

## 2021-05-24 NOTE — ED Notes (Signed)
Attempted to call report

## 2021-05-24 NOTE — ED Triage Notes (Signed)
Patient presents to ed via [facility] states family found patient laying in his bed altered LOC, states they don't think he had a seizure they didn't see any shaking. EMS states patient became more alert enroute , unsure of language, patient is pointing to his epigastric area that it hurts. , Family states this same thing happened several months ago and it resolved after several hours.

## 2021-05-25 ENCOUNTER — Observation Stay (HOSPITAL_COMMUNITY): Payer: Self-pay

## 2021-05-25 ENCOUNTER — Other Ambulatory Visit: Payer: Self-pay

## 2021-05-25 ENCOUNTER — Encounter (HOSPITAL_COMMUNITY): Payer: Self-pay | Admitting: Internal Medicine

## 2021-05-25 DIAGNOSIS — M25511 Pain in right shoulder: Secondary | ICD-10-CM

## 2021-05-25 DIAGNOSIS — R404 Transient alteration of awareness: Secondary | ICD-10-CM

## 2021-05-25 DIAGNOSIS — J9859 Other diseases of mediastinum, not elsewhere classified: Secondary | ICD-10-CM

## 2021-05-25 DIAGNOSIS — R569 Unspecified convulsions: Secondary | ICD-10-CM

## 2021-05-25 DIAGNOSIS — Z87898 Personal history of other specified conditions: Secondary | ICD-10-CM

## 2021-05-25 LAB — CBC
HCT: 35.8 % — ABNORMAL LOW (ref 39.0–52.0)
Hemoglobin: 11.5 g/dL — ABNORMAL LOW (ref 13.0–17.0)
MCH: 27.4 pg (ref 26.0–34.0)
MCHC: 32.1 g/dL (ref 30.0–36.0)
MCV: 85.4 fL (ref 80.0–100.0)
Platelets: 183 10*3/uL (ref 150–400)
RBC: 4.19 MIL/uL — ABNORMAL LOW (ref 4.22–5.81)
RDW: 11.9 % (ref 11.5–15.5)
WBC: 6.4 10*3/uL (ref 4.0–10.5)
nRBC: 0 % (ref 0.0–0.2)

## 2021-05-25 LAB — URIC ACID: Uric Acid, Serum: 8.6 mg/dL (ref 3.7–8.6)

## 2021-05-25 LAB — CK: Total CK: 872 U/L — ABNORMAL HIGH (ref 49–397)

## 2021-05-25 LAB — BASIC METABOLIC PANEL
Anion gap: 7 (ref 5–15)
BUN: 11 mg/dL (ref 8–23)
CO2: 26 mmol/L (ref 22–32)
Calcium: 8 mg/dL — ABNORMAL LOW (ref 8.9–10.3)
Chloride: 106 mmol/L (ref 98–111)
Creatinine, Ser: 0.86 mg/dL (ref 0.61–1.24)
GFR, Estimated: >60 mL/min (ref >60)
Glucose, Bld: 100 mg/dL — ABNORMAL HIGH (ref 70–99)
Potassium: 3.4 mmol/L — ABNORMAL LOW (ref 3.5–5.1)
Sodium: 139 mmol/L (ref 135–145)

## 2021-05-25 LAB — LACTIC ACID, PLASMA: Lactic Acid, Venous: 0.8 mmol/L (ref 0.5–1.9)

## 2021-05-25 MED ORDER — LACTATED RINGERS IV BOLUS
1000.0000 mL | Freq: Once | INTRAVENOUS | Status: AC
  Administered 2021-05-25: 1000 mL via INTRAVENOUS

## 2021-05-25 NOTE — Progress Notes (Signed)
Subjective: Patient is awake, interactive, improved from yesterday  Exam: Vitals:   05/24/21 2330 05/25/21 0415  BP: 128/80 126/83  Pulse: (!) 58 67  Resp: 17 15  Temp: 98.3 F (36.8 C) 98.2 F (36.8 C)  SpO2: 100% 98%   Gen: In bed, NAD Resp: non-labored breathing, no acute distress Abd: soft, nt  Neuro: MS: Awake, alert, there is some language barrier but he knows that he is in the hospital is able to follow commands and interact readily. CN: Visual fields full, EOMI Motor: 5/5 throughout Sensory: Intact to light touch  Pertinent Labs: Magnesium 2.3  Impression: 60 year old male with second unprovoked seizure.  Head CT is normal.  He had an MRI done previously, and therefore I do not feel strongly about repeating his MRI while inpatient, though he will need a repeat MRI at some point given the motion degradation on his initial.  Recommendations: 1) MRI brain at some point, could be done as an outpatient 2) Keppra 500 mg twice daily 3) neurology will be available as needed.  Ritta Slot, MD [facility] [phone]  If 7pm- 7am, please page neurology on call as listed in AMION.

## 2021-05-25 NOTE — Procedures (Signed)
Routine EEG Report  Joe Bell is a 60 y.o. male with a history of seizures who is undergoing an EEG to evaluate for seizures.  Report: This EEG was acquired with electrodes placed according to the International 10-20 electrode system (including Fp1, Fp2, F3, F4, C3, C4, P3, P4, O1, O2, T3, T4, T5, T6, A1, A2, Fz, Cz, Pz). The following electrodes were missing or displaced: none.  The occipital dominant rhythm was 9 Hz with overriding beta frequencies. This activity is reactive to stimulation. Drowsiness was manifested by background fragmentation; deeper stages of sleep were manifested by K complexes and sleep spindles. There was no focal slowing. There were no interictal epileptiform discharges. There were no electrographic seizures identified. There was no abnormal response to photic stimulation or hyperventilation.   Impression: This EEG was obtained while awake and asleep and is normal.    Clinical Correlation: Normal EEGs, however, do not rule out epilepsy.  Bing Neighbors, MD [facility] [phone]  If 7pm- 7am, please page neurology on call as listed in AMION.

## 2021-05-25 NOTE — Progress Notes (Signed)
   Subjective: Used Hausa interpretor during encounter. No acute overnight events. Patient was seen at bedside during rounds this morning. Pt reports feeling well this AM. Pt denies shoulder pain, headache, chest pain, chest fullness, and confusion. No other complains or concerns at this time. Discussed plan to continue Keppra to prevent further seizures, which he agrees. He is agreeable to following up at the [facility] for further care as he has no PCP at this time. Discussed not driving for the next 6 months given recent seizure. He understands this.  Objective:  Vital signs in last 24 hours: Vitals:   05/24/21 1830 05/24/21 1950 05/24/21 2330 05/25/21 0415  BP: 104/89 138/80 128/80 126/83  Pulse: 60 (!) 59 (!) 58 67  Resp: 16 19 17 15   Temp: 98.1 F (36.7 C) 98.4 F (36.9 C) 98.3 F (36.8 C) 98.2 F (36.8 C)  TempSrc: Oral Oral Oral Oral  SpO2: 100% 98% 100% 98%  Weight:      Height:       Constitutional: alert, well-appearing, in no acute distress HENT: normocephalic, atraumatic, mucous membranes moist, laceration on right lateral side resolved Eyes: conjunctiva non-erythematous, extraocular movements intact Cardiovascular: regular rate and rhythm, no m/r/g Pulmonary/Chest: normal work of breathing on room air, lungs clear to auscultation bilaterally, no focal tenderness with chest palpation. Abdominal: soft, non-tender to palpation, non-distended MSK: normal bulk and tone, right shoulder not tender to palpation Neurological: alert & oriented x 3, 5/5 strength in bilateral extremities Skin: warm and dry Extremities: no swelling or pitting edema  Psych: normal behavior, normal affect   Assessment/Plan:  Principal Problem:   Altered mental status Active Problems:   History of seizure   Seizure (HCC)  Joe Bell is a 60 yo Hausa speaking M from [birth year] with a PMHx of a seizure about one year ago, brought to ED due to lethargy, confusion, and admitted for second seizure.     Seizure AMS Right shoulder pain  Mediastinal fullness on CXR Pt with hx of first time seizure 1 year ago (workup negative), presented during post ictal state after a witnessed seizure. Pt is alert and interactive today. CT showing no structural abnormalities, EEG negative. MRI today. Neurology consulted, loaded with Keppra. CK continues elevating 697 -> 872 -> 2661, concerning for rhabdomyolysis (K 3.4). Continued IV LR bolus and infusion, and will repeat CK. Benign exam findings, with resolved right shoulder pain. CXR showing incidental superior mediastinal fullness, CT not concerning for mediastinal mass, but shows minimal right pleural thickening.  -Keppra 500mg  po bid -Gave 1L LR bolus and 125 cc/hr for 10 hrs, will evaluate fluid status -CK today  -BMP today  -Inpatient seizure precautions -Out patient neurology f/up appt in 2-4 weeks, referral already placed.   Best Practice: Diet: regular IVF: None,None VTE: [place] 40  Code: Full  Signature: [id number], MD  Internal Medicine Resident, PGY-1 Internal Medicine Residency  Pager: [phone] After 5pm on weekdays and 1pm on weekends: On Call pager [phone]

## 2021-05-26 ENCOUNTER — Observation Stay (HOSPITAL_COMMUNITY): Payer: Self-pay

## 2021-05-26 DIAGNOSIS — R4182 Altered mental status, unspecified: Secondary | ICD-10-CM

## 2021-05-26 LAB — BASIC METABOLIC PANEL
Anion gap: 5 (ref 5–15)
BUN: 9 mg/dL (ref 8–23)
CO2: 29 mmol/L (ref 22–32)
Calcium: 8.3 mg/dL — ABNORMAL LOW (ref 8.9–10.3)
Chloride: 107 mmol/L (ref 98–111)
Creatinine, Ser: 0.88 mg/dL (ref 0.61–1.24)
GFR, Estimated: >60 mL/min (ref >60)
Glucose, Bld: 109 mg/dL — ABNORMAL HIGH (ref 70–99)
Potassium: 3.7 mmol/L (ref 3.5–5.1)
Sodium: 141 mmol/L (ref 135–145)

## 2021-05-26 LAB — CK
Total CK: 2661 U/L — ABNORMAL HIGH (ref 49–397)
Total CK: 3427 U/L — ABNORMAL HIGH (ref 49–397)

## 2021-05-26 MED ORDER — LACTATED RINGERS IV BOLUS
1000.0000 mL | Freq: Once | INTRAVENOUS | Status: AC
  Administered 2021-05-26: 1000 mL via INTRAVENOUS

## 2021-05-26 MED ORDER — LACTATED RINGERS IV SOLN: INTRAVENOUS | Status: DC

## 2021-05-26 MED ORDER — POTASSIUM CHLORIDE CRYS ER 20 MEQ PO TBCR
40.0000 meq | EXTENDED_RELEASE_TABLET | Freq: Once | ORAL | Status: AC
  Administered 2021-05-26: 40 meq via ORAL
  Filled 2021-05-26: qty 2

## 2021-05-26 MED ORDER — LACTATED RINGERS IV SOLN: INTRAVENOUS | Status: AC

## 2021-05-26 MED ORDER — LACTATED RINGERS IV BOLUS
1000.0000 mL | Freq: Once | INTRAVENOUS | Status: AC
  Administered 2021-05-27: 1000 mL via INTRAVENOUS

## 2021-05-26 NOTE — Hospital Course (Addendum)
Seizure AMS Right shoulder pain  Mediastinal fullness on CXR Pt with hx of first time seizure 1 year ago (workup negative), presented during post ictal state after a witnessed seizure. CT today shows no structural abnormalities. Neurology consulted, loaded with Keppra. EEG and MRI normal. Elevated lactic acid on presentation (4.4, 4.1) likely from seizure activity, now resolved 0.8. UDS negative, no electrolyte derangement, Vit B12 wnl. Not likely alcohol withdrawal seizures, as pt denies current or previous EtOH use. CK uptrending, 697 -> 872 -> 2661. Continued IV fluids. Repeat CK elevated 3900. Continued to bolus IV fluids. Patient continues to feel well and denies any muscle pain. Will continue to monitor during clinic visit. Pt did not have a fall, but complained of right shoulder pain on presentation, XR shows small bone fragment along the inferior glenoid that could reflect small avulsed fragment. Shoulder pain resolved with conservative management. Benign exam findings, patient appears well and is interactive. CXR shows superior mediastinal fullness, however CT show no concerns for mediastinal mass and is reassuring.  -Keppra 500mg  po q12 hours and continue at discharge -Informed that pts with seizures are not allowed to drive until they have been seizure-free for six months, he understands and does not anticipate this being an issue.  -Inpatient seizure precautions -Out patient neurology f/up appt in 2-4 weeks, referral already placed.
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
## 2021-05-27 ENCOUNTER — Other Ambulatory Visit (HOSPITAL_COMMUNITY): Payer: Self-pay

## 2021-05-27 ENCOUNTER — Telehealth: Payer: Self-pay

## 2021-05-27 DIAGNOSIS — R569 Unspecified convulsions: Principal | ICD-10-CM

## 2021-05-27 LAB — BASIC METABOLIC PANEL
Anion gap: 4 — ABNORMAL LOW (ref 5–15)
BUN: 7 mg/dL — ABNORMAL LOW (ref 8–23)
CO2: 29 mmol/L (ref 22–32)
Calcium: 8.6 mg/dL — ABNORMAL LOW (ref 8.9–10.3)
Chloride: 107 mmol/L (ref 98–111)
Creatinine, Ser: 0.82 mg/dL (ref 0.61–1.24)
GFR, Estimated: >60 mL/min (ref >60)
Glucose, Bld: 91 mg/dL (ref 70–99)
Potassium: 3.8 mmol/L (ref 3.5–5.1)
Sodium: 140 mmol/L (ref 135–145)

## 2021-05-27 LAB — CK
Total CK: 3938 U/L — ABNORMAL HIGH (ref 49–397)
Total CK: 4070 U/L — ABNORMAL HIGH (ref 49–397)

## 2021-05-27 MED ORDER — LEVETIRACETAM 500 MG PO TABS
500.0000 mg | ORAL_TABLET | Freq: Two times a day (BID) | ORAL | 0 refills | Status: DC
  Filled 2021-05-27: qty 30, 15d supply, fill #0

## 2021-05-27 MED ORDER — LEVETIRACETAM 500 MG PO TABS
500.0000 mg | ORAL_TABLET | Freq: Two times a day (BID) | ORAL | 0 refills | Status: DC
  Filled 2021-05-27: qty 60, 30d supply, fill #0

## 2021-05-27 MED ORDER — LACTATED RINGERS IV BOLUS
1000.0000 mL | Freq: Once | INTRAVENOUS | Status: AC
  Administered 2021-05-27: 1000 mL via INTRAVENOUS

## 2021-05-27 MED ORDER — LACTATED RINGERS IV BOLUS: 1000.0000 mL | Freq: Once | INTRAVENOUS | Status: DC

## 2021-05-27 NOTE — Telephone Encounter (Signed)
Hospital TOC per Dr. Allena Katz, discharge 05/27/2021, appt 9/1.

## 2021-05-27 NOTE — Progress Notes (Signed)
Patient has ordered for discharge. Given discharge instructions with paper to the patient. Iv removed. Given all belongings to the patient. 

## 2021-05-27 NOTE — Discharge Instructions (Addendum)
Joe Bell you were admitted for a second seizure. You received an MRI and CT which did not show any structural disease. An EEG was also normal. To stop this from happening, you have to take the medication, Keppra 2 times daily. Also, please follow up with the clinic to get lab work done.

## 2021-05-27 NOTE — Care Management (Signed)
Scheduled follow up appointment  at [facility] on June 30, 2021 at 1:30 pm ( first available ) . Information placed on AVS.   Changed pharmacy to [facility] pharmacy. At discharge will see if discharge medications cover by [facility] program.

## 2021-06-05 ENCOUNTER — Encounter: Payer: Self-pay | Admitting: Student

## 2021-06-10 ENCOUNTER — Ambulatory Visit: Payer: Self-pay | Admitting: Neurology

## 2021-06-10 ENCOUNTER — Encounter: Payer: Self-pay | Admitting: Neurology

## 2021-06-10 ENCOUNTER — Other Ambulatory Visit (HOSPITAL_COMMUNITY): Payer: Self-pay

## 2021-06-10 VITALS — BP 125/77 | HR 72 | Ht 69.0 in | Wt 184.0 lb

## 2021-06-10 DIAGNOSIS — G40309 Generalized idiopathic epilepsy and epileptic syndromes, not intractable, without status epilepticus: Secondary | ICD-10-CM

## 2021-06-10 MED ORDER — LEVETIRACETAM 500 MG PO TABS
500.0000 mg | ORAL_TABLET | Freq: Two times a day (BID) | ORAL | 12 refills | Status: DC
  Filled 2021-06-10: qty 60, 30d supply, fill #0

## 2021-06-10 NOTE — Patient Instructions (Signed)
Continue your medication, Keppra 500 mg every 12 hours  We will obtain a Keppra level today  Follow up in 6 months of sooner if worse.      Per [facility] statutes, patients with seizures are not allowed to drive until they have been seizure-free for six months.  Other recommendations include using caution when using heavy equipment or power tools. Avoid working on ladders or at heights. Take showers instead of baths.  Do not swim alone.  Ensure the water temperature is not too high on the home water heater. Do not go swimming alone. Do not lock yourself in a room alone (i.e. bathroom). When caring for infants or small children, sit down when holding, feeding, or changing them to minimize risk of injury to the child in the event you have a seizure. Maintain good sleep hygiene. Avoid alcohol.  Also recommend adequate sleep, hydration, good diet and minimize stress.   During the Seizure  - First, ensure adequate ventilation and place patients on the floor on their left side  Loosen clothing around the neck and ensure the airway is patent. If the patient is clenching the teeth, do not force the mouth open with any object as this can cause severe damage - Remove all items from the surrounding that can be hazardous. The patient may be oblivious to what's happening and may not even know what he or she is doing. If the patient is confused and wandering, either gently guide him/her away and block access to outside areas - Reassure the individual and be comforting - Call 911. In most cases, the seizure ends before EMS arrives. However, there are cases when seizures may last over 3 to 5 minutes. Or the individual may have developed breathing difficulties or severe injuries. If a pregnant patient or a person with diabetes develops a seizure, it is prudent to call an ambulance. - Finally, if the patient does not regain full consciousness, then call EMS. Most patients will remain confused for about 45  to 90 minutes after a seizure, so you must use judgment in calling for help. - Avoid restraints but make sure the patient is in a bed with padded side rails - Place the individual in a lateral position with the neck slightly flexed; this will help the saliva drain from the mouth and prevent the tongue from falling backward - Remove all nearby furniture and other hazards from the area - Provide verbal assurance as the individual is regaining consciousness - Provide the patient with privacy if possible - Call for help and start treatment as ordered by the caregiver   After the Seizure (Postictal Stage)  After a seizure, most patients experience confusion, fatigue, muscle pain and/or a headache. Thus, one should permit the individual to sleep. For the next few days, reassurance is essential. Being calm and helping reorient the person is also of importance.  Most seizures are painless and end spontaneously. Seizures are not harmful to others but can lead to complications such as stress on the lungs, brain and the heart. Individuals with prior lung problems may develop labored breathing and respiratory distress.

## 2021-06-10 NOTE — Progress Notes (Signed)
[facility]  PATIENT: Joe Bell DOB: 08/03/1961  REFERRING CLINICIAN: Leda GauzeKirby-Graham, Karen J, * HISTORY FROM: Patient via [name] translator [name]  REASON FOR VISIT: Seizure    HISTORICAL  CHIEF COMPLAINT:  Chief Complaint  Patient presents with   New Patient (Initial Visit)    Rm 12, with interpreter, pt states he is doing well on Keppra 500mg  bid, states he is doing well    HISTORY OF PRESENT ILLNESS:  This is a 60 year old male with no past medical history who is presenting following a seizure.  He is here accompanied by [name] translator Mary for follow-up. H does not remember the reason why he was in the hospital.  He reported being told that he passed out and that was the reason why he was admitted in the hospital.  He mentioned that since leaving the hospital he feels much better, no additional episode of passing out and no seizures.   Per chart review patient presented to the hospital via EMS due to lethargy, one of his roommate saw him with stiff arms and shaking lasting for 10 minutes, he was unresponsive during the episode with eyes closed.  In the hospital patient was given 1 g of Keppra, had a brain MRI which was which showed no acute abnormality and EEG which was normal.  No further seizure activity noted and he was discharged on Levetiracetam 500 mg twice daily.  On further note patient had a seizure last year on February 17 at that time he was also admitted, work-up was negative but from the seizure he sustained a right humeral head fracture and underwent a reverse shoulder arthroplasty.   Handedness: Right handed   Seizure Type: general convulsion   Current frequency: 2 lifetime seizures.   Any injuries from seizures: right shoulder fracture.   Seizure risk factors: No reported seizure risk factors   Previous ASMs: Levetiracetam   Currenty ASMs: Levetiracetam 500 mg BID   ASMs side effects: None   Brain Images: No acute intracranial  abnormalities   Previous EEGs: Normal    OTHER MEDICAL CONDITIONS: None reported   REVIEW OF SYSTEMS: Full 14 system review of systems performed and negative with exception of: as noted in the HPI  ALLERGIES: No Known Allergies  HOME MEDICATIONS: Outpatient Medications Prior to Visit  Medication Sig Dispense Refill   baclofen (LIORESAL) 10 MG tablet Take 1 tablet (10 mg total) by mouth 3 (three) times daily. As needed for muscle spasm 50 tablet 0   ondansetron (ZOFRAN) 4 MG tablet Take 1 tablet (4 mg total) by mouth every 8 (eight) hours as needed for nausea or vomiting. 10 tablet 0   levETIRAcetam (KEPPRA) 500 MG tablet Take 1 tablet (500 mg total) by mouth every 12 (twelve) hours. 60 tablet 0   oxyCODONE (ROXICODONE) 5 MG immediate release tablet Take 1 tablet (5 mg total) by mouth every 4 (four) hours as needed for severe pain. 30 tablet 0   sennosides-docusate sodium (SENOKOT-S) 8.6-50 MG tablet Take 2 tablets by mouth daily. 30 tablet 1   No facility-administered medications prior to visit.    PAST MEDICAL HISTORY: History reviewed. No pertinent past medical history.  PAST SURGICAL HISTORY: Past Surgical History:  Procedure Laterality Date   REVERSE SHOULDER ARTHROPLASTY Right 11/24/2019   Procedure: REVERSE SHOULDER ARTHROPLASTY;  Surgeon: Bjorn PippinVarkey, Dax T, MD;  Location: [facility] OR;  Service: Orthopedics;  Laterality: Right;   SHOULDER HEMI-ARTHROPLASTY Right 11/24/2019   Procedure: SHOULDER HEMI-ARTHROPLASTY;  Surgeon: Bjorn PippinVarkey, Dax T,  MD;  Location: [facility] OR;  Service: Orthopedics;  Laterality: Right;    FAMILY HISTORY: History reviewed. No pertinent family history.  SOCIAL HISTORY: Social History   Socioeconomic History   Marital status: Single    Spouse name: Not on file   Number of children: Not on file   Years of education: Not on file   Highest education level: Not on file  Occupational History   Not on file  Tobacco Use   Smoking status: Never   Smokeless tobacco:  Never  Substance and Sexual Activity   Alcohol use: No    Alcohol/week: 0.0 standard drinks   Drug use: No   Sexual activity: Not Currently    Partners: Female  Other Topics Concern   Not on file  Social History Narrative   ** Merged History Encounter **       Social Determinants of Health   Financial Resource Strain: Not on file  Food Insecurity: Not on file  Transportation Needs: Not on file  Physical Activity: Not on file  Stress: Not on file  Social Connections: Not on file  Intimate Partner Violence: Not on file     PHYSICAL EXAM   GENERAL EXAM/CONSTITUTIONAL: Vitals:  Vitals:   06/10/21 0832  BP: 125/77  Pulse: 72  Weight: 184 lb (83.5 kg)  Height: 5\' 9"  (1.753 m)   Body mass index is 27.17 kg/m. Wt Readings from Last 3 Encounters:  06/10/21 184 lb (83.5 kg)  05/24/21 200 lb (90.7 kg)  11/24/19 191 lb 12.8 oz (87 kg)   Patient is in no distress; well developed, nourished and groomed; neck is supple  EYES: Pupils round and reactive to light, Visual fields full to confrontation, Extraocular movements intacts  MUSCULOSKELETAL: Gait, strength, tone, movements noted in Neurologic exam below  NEUROLOGIC: MENTAL STATUS:  awake, alert, oriented to person, place and time recent and remote memory intact normal attention and concentration language fluent, comprehension intact, naming intact fund of knowledge appropriate  CRANIAL NERVE:  2nd, 3rd, 4th, 6th - pupils equal and reactive to light, visual fields full to confrontation, extraocular muscles intact, no nystagmus 5th - facial sensation symmetric 7th - facial strength symmetric 8th - hearing intact 9th - palate elevates symmetrically, uvula midline 11th - shoulder shrug symmetric 12th - tongue protrusion midline  MOTOR:  normal bulk and tone, full strength in the BUE, BLE  SENSORY:  normal and symmetric to light touch, pinprick, temperature, vibration  COORDINATION:  finger-nose-finger, fine  finger movements normal  REFLEXES:  deep tendon reflexes present and symmetric  GAIT/STATION:  normal    DIAGNOSTIC DATA (LABS, IMAGING, TESTING) - I reviewed patient records, labs, notes, testing and imaging myself where available.  Lab Results  Component Value Date   WBC 6.4 05/25/2021   HGB 11.5 (L) 05/25/2021   HCT 35.8 (L) 05/25/2021   MCV 85.4 05/25/2021   PLT 183 05/25/2021      Component Value Date/Time   NA 140 05/27/2021 0648   K 3.8 05/27/2021 0648   CL 107 05/27/2021 0648   CO2 29 05/27/2021 0648   GLUCOSE 91 05/27/2021 0648   BUN 7 (L) 05/27/2021 0648   CREATININE 0.82 05/27/2021 0648   CALCIUM 8.6 (L) 05/27/2021 0648   PROT 7.1 05/24/2021 0923   ALBUMIN 3.7 05/24/2021 0923   AST 30 05/24/2021 0923   ALT 23 05/24/2021 0923   ALKPHOS 46 05/24/2021 0923   BILITOT 0.4 05/24/2021 0923   GFRNONAA >60 05/27/2021 05/29/2021  GFRAA >60 11/25/2019 0327   No results found for: CHOL, HDL, LDLCALC, LDLDIRECT, TRIG Lab Results  Component Value Date   HGBA1C 5.0 11/23/2019   Lab Results  Component Value Date   VITAMINB12 691 05/24/2021   Lab Results  Component Value Date   TSH 0.746 05/24/2021    MRI Brain:  1. No acute intracranial abnormality. 2. Mild chronic small vessel ischemic disease.  EEG: This EEG was obtained while awake and asleep and is normal.   I personally reviewed brain Images and previous EEG reports.   ASSESSMENT AND PLAN  60 y.o. year old male  with no past medical history who is presenting after a second lifetime seizure.  Patient had his first seizure last year, February 17 where he sustained a right humoral head fracture, at that time he was not started on any antiseizure medication.  He is presenting after a second episode on August 20; work-up has been negative so far.  He is currently on 500 mg of levetiracetam twice a day and denies any side effect from the medication.  He denies any additional seizures since being home.  I will  continue his levetiracetam at 500 mg twice daily, I will obtain a level today.  I spent extended amount of time discussing the diagnosis of seizure and what it means for the patient.  He understands that he is not supposed to drive for at least 6 months.  I will follow-up with him in 32-month and advised the patient to contact me for any additional seizure or any additional questions    1. Generalized idiopathic epilepsy and epileptic syndromes, not intractable, without status epilepticus (HCC)       PLAN:  Continue your medication, Keppra 500 mg every 12 hours  We will obtain a Keppra level today  Follow up in 6 months of sooner if worse.   Per [facility] statutes, patients with seizures are not allowed to drive until they have been seizure-free for six months.  Other recommendations include using caution when using heavy equipment or power tools. Avoid working on ladders or at heights. Take showers instead of baths.  Do not swim alone.  Ensure the water temperature is not too high on the home water heater. Do not go swimming alone. Do not lock yourself in a room alone (i.e. bathroom). When caring for infants or small children, sit down when holding, feeding, or changing them to minimize risk of injury to the child in the event you have a seizure. Maintain good sleep hygiene. Avoid alcohol.  Also recommend adequate sleep, hydration, good diet and minimize stress.   During the Seizure  - First, ensure adequate ventilation and place patients on the floor on their left side  Loosen clothing around the neck and ensure the airway is patent. If the patient is clenching the teeth, do not force the mouth open with any object as this can cause severe damage - Remove all items from the surrounding that can be hazardous. The patient may be oblivious to what's happening and may not even know what he or she is doing. If the patient is confused and wandering, either gently guide him/her away and  block access to outside areas - Reassure the individual and be comforting - Call 911. In most cases, the seizure ends before EMS arrives. However, there are cases when seizures may last over 3 to 5 minutes. Or the individual may have developed breathing difficulties or severe injuries. If a pregnant patient  or a person with diabetes develops a seizure, it is prudent to call an ambulance. - Finally, if the patient does not regain full consciousness, then call EMS. Most patients will remain confused for about 45 to 90 minutes after a seizure, so you must use judgment in calling for help. - Avoid restraints but make sure the patient is in a bed with padded side rails - Place the individual in a lateral position with the neck slightly flexed; this will help the saliva drain from the mouth and prevent the tongue from falling backward - Remove all nearby furniture and other hazards from the area - Provide verbal assurance as the individual is regaining consciousness - Provide the patient with privacy if possible - Call for help and start treatment as ordered by the caregiver   After the Seizure (Postictal Stage)  After a seizure, most patients experience confusion, fatigue, muscle pain and/or a headache. Thus, one should permit the individual to sleep. For the next few days, reassurance is essential. Being calm and helping reorient the person is also of importance.  Most seizures are painless and end spontaneously. Seizures are not harmful to others but can lead to complications such as stress on the lungs, brain and the heart. Individuals with prior lung problems may develop labored breathing and respiratory distress.     Orders Placed This Encounter  Procedures   Levetiracetam level     Meds ordered this encounter  Medications   levETIRAcetam (KEPPRA) 500 MG tablet    Sig: Take 1 tablet (500 mg total) by mouth every 12 (twelve) hours.    Dispense:  60 tablet    Refill:  12     Return in  about 6 months (around 12/08/2021).    Windell Norfolk, MD 06/10/2021, 9:38 AM  [facility] [address], [address] [place], [place] [address] [phone]

## 2021-06-12 LAB — LEVETIRACETAM LEVEL: Levetiracetam Lvl: 13.4 ug/mL (ref 10.0–40.0)

## 2021-06-30 ENCOUNTER — Telehealth: Payer: Self-pay | Admitting: Neurology

## 2021-06-30 ENCOUNTER — Encounter (INDEPENDENT_AMBULATORY_CARE_PROVIDER_SITE_OTHER): Payer: Self-pay | Admitting: Primary Care

## 2021-06-30 ENCOUNTER — Other Ambulatory Visit: Payer: Self-pay

## 2021-06-30 ENCOUNTER — Ambulatory Visit (INDEPENDENT_AMBULATORY_CARE_PROVIDER_SITE_OTHER): Payer: Self-pay | Admitting: Primary Care

## 2021-06-30 VITALS — BP 124/84 | HR 81 | Temp 97.3°F | Ht 69.0 in | Wt 183.8 lb

## 2021-06-30 DIAGNOSIS — Z09 Encounter for follow-up examination after completed treatment for conditions other than malignant neoplasm: Secondary | ICD-10-CM

## 2021-06-30 DIAGNOSIS — Z7689 Persons encountering health services in other specified circumstances: Secondary | ICD-10-CM

## 2021-06-30 DIAGNOSIS — H6123 Impacted cerumen, bilateral: Secondary | ICD-10-CM

## 2021-06-30 MED ORDER — LEVETIRACETAM 500 MG PO TABS: 500.0000 mg | ORAL_TABLET | Freq: Two times a day (BID) | ORAL | 12 refills | Status: DC

## 2021-06-30 NOTE — Telephone Encounter (Signed)
The friend to pt is asking that the levETIRAcetam (KEPPRA) 500 MG tablet now only be called into [facility] [phone]

## 2021-06-30 NOTE — Progress Notes (Signed)
  [facility]   Subjective:   Joe Bell is a 60 y.o. male presents for hospital follow up and establish care.( Interpreter:Mahamadou  Language Hausa )On 05/25/21 he was brought to the emergency room for lethargy, confusion, and a witnessed seizure-like episode.  He was was drowsy, and complained of a headache and right shoulder pain. Admit date to the hospital was 05/24/21, patient was discharged from the hospital on 05/27/21, patient was admitted for: Altered mental status.  No past medical history on file.   No Known Allergies    Current Outpatient Medications on File Prior to Visit  Medication Sig Dispense Refill   levETIRAcetam (KEPPRA) 500 MG tablet Take 1 tablet (500 mg total) by mouth every 12 (twelve) hours. 60 tablet 12   No current facility-administered medications on file prior to visit.   Review of System: Review of Systems  All other systems reviewed and are negative.  Objective:  BP 124/84 (BP Location: Right Arm, Patient Position: Sitting, Cuff Size: Large)   Pulse 81   Temp (!) 97.3 F (36.3 C) (Temporal)   Ht 5\' 9"  (1.753 m)   Wt 183 lb 12.8 oz (83.4 kg)   SpO2 96%   BMI 27.14 kg/m   Filed Weights   06/30/21 1344  Weight: 183 lb 12.8 oz (83.4 kg)    Physical Exam: General Appearance: Well nourished, in no apparent distress. Eyes: PERRLA, EOMs, conjunctiva no swelling or erythema Sinuses: No Frontal/maxillary tenderness ENT/Mouth: Ext aud canals clear, TMs not visible cerumen impaction bilateral , no bulging. 07/02/21 Hearing normal.  Neck: Supple, thyroid normal.  Respiratory: Respiratory effort normal, BS equal bilaterally without rales, rhonchi, wheezing or stridor.  Cardio: RRR with no MRGs. Brisk peripheral pulses without edema.  Abdomen: Soft, + BS.  Non tender, no guarding, rebound, hernias, masses. Lymphatics: Non tender without lymphadenopathy.  Musculoskeletal: Full ROM, 5/5 strength, normal gait.  Skin: Warm, dry without rashes,  lesions, ecchymosis.  Neuro: Cranial nerves intact. Normal muscle tone, no cerebellar symptoms. Sensation intact.  Psych: Awake and oriented X 3, normal affect, Insight and Judgment appropriate.    Assessment:  Joe Bell was seen today for hospitalization follow-up.  Diagnoses and all orders for this visit:  Encounter to establish care Establish care with PCP  Hospital discharge follow-up Hospital  discharge Please [facility]21 at 3:15. Rescheduled and seen 06/30/21. Seen by Dr. 07/02/21 on 06/10/21 for Generalized idiopathic epilepsy and epileptic syndromes, not intractable, without status epilepticus (HCC). Requested refills advise to call neurologist   Bilateral impacted cerumen  Return for irrigation  This note has been created with Dragon speech recognition software and smart 08/10/21. Any transcriptional errors are unintentional.   Lobbyist, NP 06/30/2021, 1:56 PM

## 2021-07-27 ENCOUNTER — Emergency Department (HOSPITAL_COMMUNITY)
Admission: EM | Admit: 2021-07-27 | Discharge: 2021-07-27 | Disposition: A | Discharge status: Home / Self Care | Payer: Self-pay | Attending: Emergency Medicine | Admitting: Emergency Medicine

## 2021-07-27 DIAGNOSIS — Z8669 Personal history of other diseases of the nervous system and sense organs: Secondary | ICD-10-CM | POA: Insufficient documentation

## 2021-07-27 DIAGNOSIS — Z91199 Patient's noncompliance with other medical treatment and regimen due to unspecified reason: Secondary | ICD-10-CM

## 2021-07-27 DIAGNOSIS — R569 Unspecified convulsions: Secondary | ICD-10-CM | POA: Insufficient documentation

## 2021-07-27 DIAGNOSIS — Z79899 Other long term (current) drug therapy: Secondary | ICD-10-CM | POA: Insufficient documentation

## 2021-07-27 DIAGNOSIS — Y900 Blood alcohol level of less than 20 mg/100 ml: Secondary | ICD-10-CM | POA: Insufficient documentation

## 2021-07-27 LAB — BASIC METABOLIC PANEL
Anion gap: 9 (ref 5–15)
BUN: 18 mg/dL (ref 8–23)
CO2: 23 mmol/L (ref 22–32)
Calcium: 8.3 mg/dL — ABNORMAL LOW (ref 8.9–10.3)
Chloride: 105 mmol/L (ref 98–111)
Creatinine, Ser: 1.05 mg/dL (ref 0.61–1.24)
GFR, Estimated: >60 mL/min (ref >60)
Glucose, Bld: 165 mg/dL — ABNORMAL HIGH (ref 70–99)
Potassium: 3.8 mmol/L (ref 3.5–5.1)
Sodium: 137 mmol/L (ref 135–145)

## 2021-07-27 LAB — CBC
HCT: 38.3 % — ABNORMAL LOW (ref 39.0–52.0)
Hemoglobin: 12.2 g/dL — ABNORMAL LOW (ref 13.0–17.0)
MCH: 27.7 pg (ref 26.0–34.0)
MCHC: 31.9 g/dL (ref 30.0–36.0)
MCV: 87 fL (ref 80.0–100.0)
Platelets: 199 10*3/uL (ref 150–400)
RBC: 4.4 MIL/uL (ref 4.22–5.81)
RDW: 11.8 % (ref 11.5–15.5)
WBC: 3.6 10*3/uL — ABNORMAL LOW (ref 4.0–10.5)
nRBC: 0 % (ref 0.0–0.2)

## 2021-07-27 LAB — ETHANOL: Alcohol, Ethyl (B): <10 mg/dL (ref <10)

## 2021-07-27 MED ORDER — LEVETIRACETAM IN NACL 1000 MG/100ML IV SOLN
1000.0000 mg | Freq: Once | INTRAVENOUS | Status: AC
  Administered 2021-07-27: 1000 mg via INTRAVENOUS
  Filled 2021-07-27: qty 100

## 2021-07-27 NOTE — ED Triage Notes (Signed)
Pt BIB [facility], unwitness seizure at home. Post-ictal on scene per EMS. Pt has hx of seizures, unsure about compliance. Pt states he takes his seizure meds q 12 hrs. He last took it Saturday morning

## 2021-07-27 NOTE — Discharge Instructions (Signed)
Please take your Keppra twice a day as prescribed and do not miss any doses. Follow-up with your doctor this week Return if you have any problems Per [facility] statutes, patients with seizures are not allowed to drive until they have been seizure-free for six months. Use caution when using heavy equipment or power tools. Avoid working on ladders or at heights. Take showers instead of baths. Ensure the water temperature is not too high on the home water heater. Do not go swimming alone. Do not lock yourself in a room alone (i.e. bathroom). When caring for infants or small children, sit down when holding, feeding, or changing them to minimize risk of injury to the child in the event you have a seizure. Maintain good sleep hygiene. Avoid alcohol.    If Joe Bell has another seizure, call 911 and bring them back to the ED if:       A.  The seizure lasts longer than 5 minutes.            B.  The patient doesn't wake shortly after the seizure or has new problems such as difficulty seeing, speaking or moving following the seizure       C.  The patient was injured during the seizure       D.  The patient has a temperature over 102 F (39C)       E.  The patient vomited during the seizure and now is having trouble breathing

## 2021-07-27 NOTE — ED Provider Notes (Signed)
[facility] EMERGENCY DEPARTMENT Provider Note   CSN: [id number] Arrival date & time: 07/27/21  7829     History Chief Complaint  Patient presents with   Seizures    Joe Bell is a 60 y.o. male.  HPI Level 5 caveat secondary to language barrier Hausa interpreter unavailable through our interpretation service Patient speaks has hausa, but also speaks [place] and at the initial evaluation no translator was present 60 year old male with seizure disorder presents today with report of seizures.  Patient has bottle of Keppra but is not appear to have taken very many over the past month when it was last filled.  Was reported that patient likely had a seizure this morning.  He apparently was somewhat confused initially.  Now he states that he had a seizure but is unable to give me any details.     No past medical history on file.  Patient Active Problem List   Diagnosis Date Noted   AMS (altered mental status) 05/26/2021   Seizure (HCC)    Mediastinal abnormality    Shoulder pain, right    Altered mental status 05/24/2021   History of seizure 05/24/2021   Shoulder fracture, right 11/24/2019   First time seizure (HCC) 11/22/2019   Hyperglycemia 11/22/2019   Leukocytosis 11/22/2019    Past Surgical History:  Procedure Laterality Date   REVERSE SHOULDER ARTHROPLASTY Right 11/24/2019   Procedure: REVERSE SHOULDER ARTHROPLASTY;  Surgeon: Bjorn Pippin, MD;  Location: [facility] OR;  Service: Orthopedics;  Laterality: Right;   SHOULDER HEMI-ARTHROPLASTY Right 11/24/2019   Procedure: SHOULDER HEMI-ARTHROPLASTY;  Surgeon: Bjorn Pippin, MD;  Location: [facility] OR;  Service: Orthopedics;  Laterality: Right;       No family history on file.  Social History   Tobacco Use   Smoking status: Never   Smokeless tobacco: Never  Substance Use Topics   Alcohol use: No    Alcohol/week: 0.0 standard drinks   Drug use: No    Home Medications Prior to Admission medications    Medication Sig Start Date End Date Taking? Authorizing Provider  levETIRAcetam (KEPPRA) 500 MG tablet Take 1 tablet (500 mg total) by mouth every 12 (twelve) hours. 06/30/21   Windell Norfolk, MD    Allergies    Patient has no known allergies.  Review of Systems   Review of Systems  All other systems reviewed and are negative.  Physical Exam Updated Vital Signs BP 130/90 (BP Location: Right Arm)   Pulse 80   Temp 98.4 F (36.9 C) (Oral)   Resp 16   SpO2 96%   Physical Exam Vitals and nursing note reviewed.  Constitutional:      Appearance: Normal appearance.  HENT:     Head: Normocephalic.     Right Ear: External ear normal.     Left Ear: External ear normal.     Nose: Nose normal.     Mouth/Throat:     Pharynx: Oropharynx is clear.  Eyes:     Extraocular Movements: Extraocular movements intact.     Pupils: Pupils are equal, round, and reactive to light.  Cardiovascular:     Rate and Rhythm: Normal rate and regular rhythm.     Pulses: Normal pulses.  Pulmonary:     Effort: Pulmonary effort is normal.  Abdominal:     General: Abdomen is flat.  Musculoskeletal:        General: Normal range of motion.     Cervical back: Normal range of motion.  Skin:  General: Skin is warm.  Neurological:     General: No focal deficit present.     Mental Status: He is alert.  Psychiatric:        Mood and Affect: Mood normal.    ED Results / Procedures / Treatments   Labs (all labs ordered are listed, but only abnormal results are displayed) Labs Reviewed  CBC  BASIC METABOLIC PANEL  ETHANOL    EKG None  Radiology No results found.  Procedures Procedures   Medications Ordered in ED Medications  levETIRAcetam (KEPPRA) IVPB 1000 mg/100 mL premix (has no administration in time range)    ED Course  I have reviewed the triage vital signs and the nursing notes.  Pertinent labs & imaging results that were available during my care of the patient were reviewed by me  and considered in my medical decision making (see chart for details).  Clinical Course as of 07/27/21 9735  Wynelle Link Jul 27, 2021  3299 Labs reviewed and interpreted with mild hyperglycemia and mild leukopenia noted. [DR]    Clinical Course User Index [DR] Margarita Grizzle, MD   MDM Rules/Calculators/A&P                           Patient received Keppra here in the ED.  No acute metabolic abnormalities that would explain seizure.  From bottle and discussion with patient he is not appear to be taking his Keppra regularly.  I have reiterated the need for the patient to take his Keppra on a regular basis.  He is advised regarding return precautions and follow-up and voices understanding.  Final Clinical Impression(s) / ED Diagnoses Final diagnoses:  None    Rx / DC Orders ED Discharge Orders     None        Margarita Grizzle, MD 07/27/21 [phone]

## 2021-12-08 ENCOUNTER — Ambulatory Visit: Payer: MEDICAID | Admitting: Neurology

## 2021-12-08 ENCOUNTER — Encounter: Payer: Self-pay | Admitting: Neurology

## 2022-08-13 ENCOUNTER — Other Ambulatory Visit: Payer: Self-pay | Admitting: Neurology

## 2022-08-18 ENCOUNTER — Other Ambulatory Visit: Payer: Self-pay

## 2022-08-18 MED ORDER — LEVETIRACETAM 500 MG PO TABS: 500.0000 mg | ORAL_TABLET | Freq: Two times a day (BID) | ORAL | 0 refills | Status: DC

## 2022-09-17 ENCOUNTER — Other Ambulatory Visit: Payer: Self-pay

## 2022-09-17 ENCOUNTER — Telehealth: Payer: Self-pay | Admitting: Neurology

## 2022-09-17 MED ORDER — LEVETIRACETAM 500 MG PO TABS: 500.0000 mg | ORAL_TABLET | Freq: Two times a day (BID) | ORAL | 1 refills | Status: DC

## 2022-09-17 NOTE — Telephone Encounter (Signed)
Pt's friend, Engineer, structural request refill for evETIRAcetam (KEPPRA) 500 MG tablet  at   [id number]   Schedule appt on 12/02/22 at 1:45 pm

## 2022-09-17 NOTE — Telephone Encounter (Signed)
Approved and noted pt must complete appt for further refills

## 2022-10-14 ENCOUNTER — Encounter: Payer: Self-pay | Admitting: Neurology

## 2022-11-24 ENCOUNTER — Other Ambulatory Visit: Payer: Self-pay | Admitting: Neurology

## 2022-12-02 ENCOUNTER — Ambulatory Visit: Payer: Self-pay | Admitting: Neurology

## 2022-12-22 ENCOUNTER — Other Ambulatory Visit: Payer: Self-pay | Admitting: Neurology

## 2023-01-05 ENCOUNTER — Encounter: Payer: Self-pay | Admitting: Neurology

## 2023-01-05 ENCOUNTER — Ambulatory Visit: Payer: Self-pay | Admitting: Neurology

## 2023-02-09 ENCOUNTER — Ambulatory Visit (INDEPENDENT_AMBULATORY_CARE_PROVIDER_SITE_OTHER): Payer: Self-pay | Admitting: Neurology

## 2023-02-09 ENCOUNTER — Encounter: Payer: Self-pay | Admitting: Neurology

## 2023-02-09 VITALS — BP 105/71 | HR 70 | Ht 69.0 in | Wt 173.0 lb

## 2023-02-09 DIAGNOSIS — G40909 Epilepsy, unspecified, not intractable, without status epilepticus: Secondary | ICD-10-CM

## 2023-02-09 MED ORDER — LEVETIRACETAM 500 MG PO TABS: 500.0000 mg | ORAL_TABLET | Freq: Two times a day (BID) | ORAL | 3 refills | Status: DC

## 2023-02-09 NOTE — Patient Instructions (Signed)
Continue with Keppra 500 mg twice daily, refill given Continue with your other medications Continue to follow with PCP Return in 1 year or sooner if worse.

## 2023-02-09 NOTE — Progress Notes (Signed)
[facility]  PATIENT: Joe Bell DOB: 15-Dec-1960  REFERRING CLINICIAN: No ref. provider found HISTORY FROM: Patient via [place] translator Hilda Lias  REASON FOR VISIT: Seizure    HISTORICAL  CHIEF COMPLAINT:  Chief Complaint  Patient presents with   Follow-up    Rm 12. Accompanied by friend. F/u medication. He is requesting medication refills, unsure of medication name.   INTERVAL HISTORY 02/09/2023:  Patient presents today for follow-up, last visit was in September 2022.  At that time plan was for him to continue with Keppra 500 mg twice daily.  He has been doing well since then but did have a breakthrough seizure on October 2022 in the setting of medication noncompliance.  Since then he has been doing well, no additional seizures.  He is compliant with his Keppra 500 mg twice daily, denies any side effect from the medication.  Overall he is doing very well.  No other complaints and no other concerns.  HISTORY OF PRESENT ILLNESS:  This is a 62 year old male with no past medical history who is presenting following a seizure.  He is here accompanied by Lilli Few for follow-up. He does not remember the reason why he was in the hospital.  He reported being told that he passed out and that was the reason why he was admitted in the hospital.  He mentioned that since leaving the hospital he feels much better, no additional episode of passing out and no seizures.   Per chart review patient presented to the hospital via EMS due to lethargy, one of his roommate saw him with stiff arms and shaking lasting for 10 minutes, he was unresponsive during the episode with eyes closed.  In the hospital patient was given 1 g of Keppra, had a brain MRI which was which showed no acute abnormality and EEG which was normal.  No further seizure activity noted and he was discharged on Levetiracetam 500 mg twice daily.  On further note patient had a seizure last year on February 17 at that  time he was also admitted, work-up was negative but from the seizure he sustained a right humeral head fracture and underwent a reverse shoulder arthroplasty.   Handedness: Right handed   Seizure Type: general convulsion   Current frequency: 2 lifetime seizures.   Any injuries from seizures: right shoulder fracture.   Seizure risk factors: No reported seizure risk factors   Previous ASMs: Levetiracetam   Currenty ASMs: Levetiracetam 500 mg BID   ASMs side effects: None   Brain Images: No acute intracranial abnormalities   Previous EEGs: Normal    OTHER MEDICAL CONDITIONS: None reported   REVIEW OF SYSTEMS: Full 14 system review of systems performed and negative with exception of: as noted in the HPI  ALLERGIES: No Known Allergies  HOME MEDICATIONS: Outpatient Medications Prior to Visit  Medication Sig Dispense Refill   levETIRAcetam (KEPPRA) 500 MG tablet TAKE 1 TABLET BY MOUTH EVERY 12 HOURS . APPOINTMENT REQUIRED FOR FUTURE REFILLS 60 tablet 0   No facility-administered medications prior to visit.    PAST MEDICAL HISTORY: History reviewed. No pertinent past medical history.  PAST SURGICAL HISTORY: Past Surgical History:  Procedure Laterality Date   REVERSE SHOULDER ARTHROPLASTY Right 11/24/2019   Procedure: REVERSE SHOULDER ARTHROPLASTY;  Surgeon: Bjorn Pippin, MD;  Location: [facility] OR;  Service: Orthopedics;  Laterality: Right;   SHOULDER HEMI-ARTHROPLASTY Right 11/24/2019   Procedure: SHOULDER HEMI-ARTHROPLASTY;  Surgeon: Bjorn Pippin, MD;  Location: [facility] OR;  Service:  Orthopedics;  Laterality: Right;    FAMILY HISTORY: History reviewed. No pertinent family history.  SOCIAL HISTORY: Social History   Socioeconomic History   Marital status: Single    Spouse name: Not on file   Number of children: Not on file   Years of education: Not on file   Highest education level: Not on file  Occupational History   Not on file  Tobacco Use   Smoking status: Never    Smokeless tobacco: Never  Substance and Sexual Activity   Alcohol use: No    Alcohol/week: 0.0 standard drinks of alcohol   Drug use: No   Sexual activity: Not Currently    Partners: Female  Other Topics Concern   Not on file  Social History Narrative   ** Merged History Encounter **       Social Determinants of Health   Financial Resource Strain: Not on file  Food Insecurity: Not on file  Transportation Needs: Not on file  Physical Activity: Not on file  Stress: Not on file  Social Connections: Not on file  Intimate Partner Violence: Not on file     PHYSICAL EXAM   GENERAL EXAM/CONSTITUTIONAL: Vitals:  Vitals:   02/09/23 1454  BP: 105/71  Pulse: 70  Weight: 173 lb (78.5 kg)  Height: 5\' 9"  (1.753 m)    Body mass index is 25.55 kg/m. Wt Readings from Last 3 Encounters:  02/09/23 173 lb (78.5 kg)  06/30/21 183 lb 12.8 oz (83.4 kg)  06/10/21 184 lb (83.5 kg)   Patient is in no distress; well developed, nourished and groomed; neck is supple  MUSCULOSKELETAL: Gait, strength, tone, movements noted in Neurologic exam below  NEUROLOGIC: MENTAL STATUS:  awake, alert, oriented to person, place and time recent and remote memory intact normal attention and concentration language fluent, comprehension intact, naming intact fund of knowledge appropriate  CRANIAL NERVE:  2nd, 3rd, 4th, 6th - visual fields full to confrontation, extraocular muscles intact, no nystagmus 5th - facial sensation symmetric 7th - facial strength symmetric 8th - hearing intact 9th - palate elevates symmetrically, uvula midline 11th - shoulder shrug symmetric 12th - tongue protrusion midline  MOTOR:  normal bulk and tone, full strength in the BUE, BLE  SENSORY:  normal and symmetric to light touch, pinprick, temperature, vibration  COORDINATION:  finger-nose-finger, fine finger movements normal  REFLEXES:  deep tendon reflexes present and symmetric  GAIT/STATION:   normal    DIAGNOSTIC DATA (LABS, IMAGING, TESTING) - I reviewed patient records, labs, notes, testing and imaging myself where available.  Lab Results  Component Value Date   WBC 3.6 (L) 07/27/2021   HGB 12.2 (L) 07/27/2021   HCT 38.3 (L) 07/27/2021   MCV 87.0 07/27/2021   PLT 199 07/27/2021      Component Value Date/Time   NA 137 07/27/2021 0724   K 3.8 07/27/2021 0724   CL 105 07/27/2021 0724   CO2 23 07/27/2021 0724   GLUCOSE 165 (H) 07/27/2021 0724   BUN 18 07/27/2021 0724   CREATININE 1.05 07/27/2021 0724   CALCIUM 8.3 (L) 07/27/2021 0724   PROT 7.1 05/24/2021 0923   ALBUMIN 3.7 05/24/2021 0923   AST 30 05/24/2021 0923   ALT 23 05/24/2021 0923   ALKPHOS 46 05/24/2021 0923   BILITOT 0.4 05/24/2021 0923   GFRNONAA >60 07/27/2021 0724   GFRAA >60 11/25/2019 0327   No results found for: "CHOL", "HDL", "LDLCALC", "LDLDIRECT", "TRIG" Lab Results  Component Value Date   HGBA1C 5.0  11/23/2019   Lab Results  Component Value Date   VITAMINB12 691 05/24/2021   Lab Results  Component Value Date   TSH 0.746 05/24/2021    MRI Brain:  1. No acute intracranial abnormality. 2. Mild chronic small vessel ischemic disease.  EEG: This EEG was obtained while awake and asleep and is normal.   I personally reviewed brain Images and previous EEG reports.   ASSESSMENT AND PLAN  62 y.o. year old male  with history of epilepsy who is presenting for follow-up.  Last visit was in September 2022.  Since then he had 1 breakthrough seizure in the setting of medication noncompliance.  Since then she has been doing well on the Keppra 500 mg twice daily, denies any seizure or side effect.  Plan is for patient to continue with Keppra 500 mg twice daily, continue his other medications and I will see him in 1 year for follow-up.  He voices understanding.   1. Nonintractable epilepsy without status epilepticus, unspecified epilepsy type [facility])     Patient Instructions  Continue with  Keppra 500 mg twice daily, refill given Continue with your other medications Continue to follow with PCP Return in 1 year or sooner if worse.   Per [facility] statutes, patients with seizures are not allowed to drive until they have been seizure-free for six months.  Other recommendations include using caution when using heavy equipment or power tools. Avoid working on ladders or at heights. Take showers instead of baths.  Do not swim alone.  Ensure the water temperature is not too high on the home water heater. Do not go swimming alone. Do not lock yourself in a room alone (i.e. bathroom). When caring for infants or small children, sit down when holding, feeding, or changing them to minimize risk of injury to the child in the event you have a seizure. Maintain good sleep hygiene. Avoid alcohol.  Also recommend adequate sleep, hydration, good diet and minimize stress.   During the Seizure  - First, ensure adequate ventilation and place patients on the floor on their left side  Loosen clothing around the neck and ensure the airway is patent. If the patient is clenching the teeth, do not force the mouth open with any object as this can cause severe damage - Remove all items from the surrounding that can be hazardous. The patient may be oblivious to what's happening and may not even know what he or she is doing. If the patient is confused and wandering, either gently guide him/her away and block access to outside areas - Reassure the individual and be comforting - Call 911. In most cases, the seizure ends before EMS arrives. However, there are cases when seizures may last over 3 to 5 minutes. Or the individual may have developed breathing difficulties or severe injuries. If a pregnant patient or a person with diabetes develops a seizure, it is prudent to call an ambulance. - Finally, if the patient does not regain full consciousness, then call EMS. Most patients will remain confused for about  45 to 90 minutes after a seizure, so you must use judgment in calling for help. - Avoid restraints but make sure the patient is in a bed with padded side rails - Place the individual in a lateral position with the neck slightly flexed; this will help the saliva drain from the mouth and prevent the tongue from falling backward - Remove all nearby furniture and other hazards from the area - Provide verbal  assurance as the individual is regaining consciousness - Provide the patient with privacy if possible - Call for help and start treatment as ordered by the caregiver   After the Seizure (Postictal Stage)  After a seizure, most patients experience confusion, fatigue, muscle pain and/or a headache. Thus, one should permit the individual to sleep. For the next few days, reassurance is essential. Being calm and helping reorient the person is also of importance.  Most seizures are painless and end spontaneously. Seizures are not harmful to others but can lead to complications such as stress on the lungs, brain and the heart. Individuals with prior lung problems may develop labored breathing and respiratory distress.     No orders of the defined types were placed in this encounter.    Meds ordered this encounter  Medications   levETIRAcetam (KEPPRA) 500 MG tablet    Sig: Take 1 tablet (500 mg total) by mouth 2 (two) times daily.    Dispense:  180 tablet    Refill:  3     Return in about 1 year (around 02/09/2024).    Windell Norfolk, MD 02/09/2023, 4:57 PM  [facility] [address], [address] [place], [place] [address] [phone]

## 2023-05-11 IMAGING — CT CT CHEST W/O CM
2 of 3 series · 15 of 36 positions shown, 18 images · non-contrast
Comparison: Chest radiograph dated 05/25/2021. chest CT dated
08/14/2015.

CLINICAL DATA: Mediastinal mass.

EXAM:
CT CHEST WITHOUT CONTRAST
TECHNIQUE: Multidetector CT imaging of the chest was performed following the
standard protocol without IV contrast.

[Series 3: chest w/o 2mm st · axial · non-contrast · 0.74mm/px · z∈[-341,-43]mm · 12 of 175 slices shown, 15 images]
[im 13/175  mediastinal]
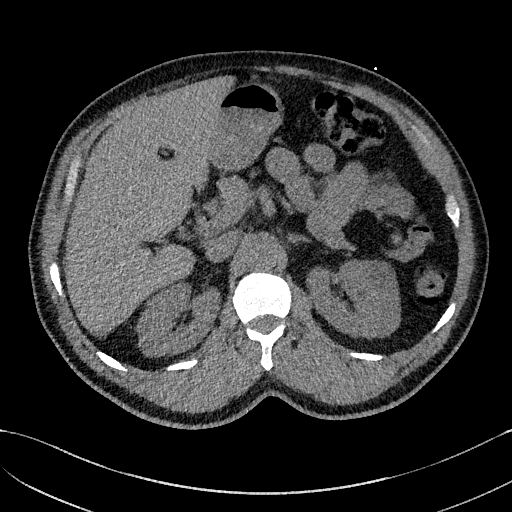
[im 13/175  lung]
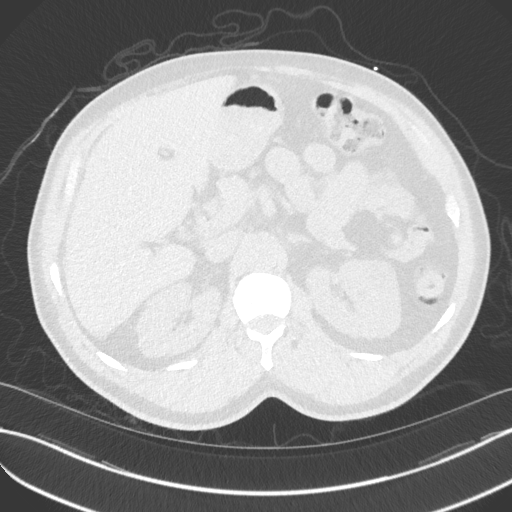
[im 26/175  lung]
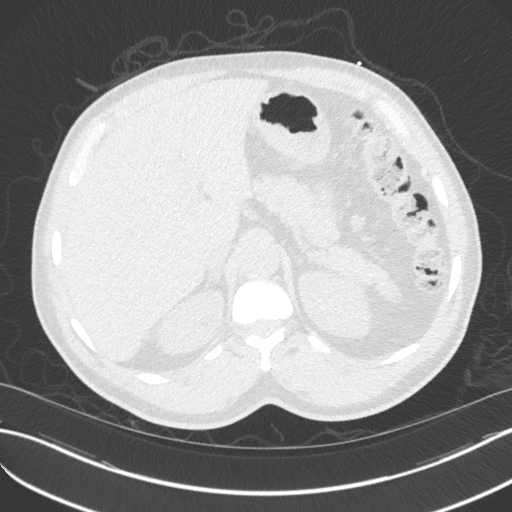
[im 39/175  lung]
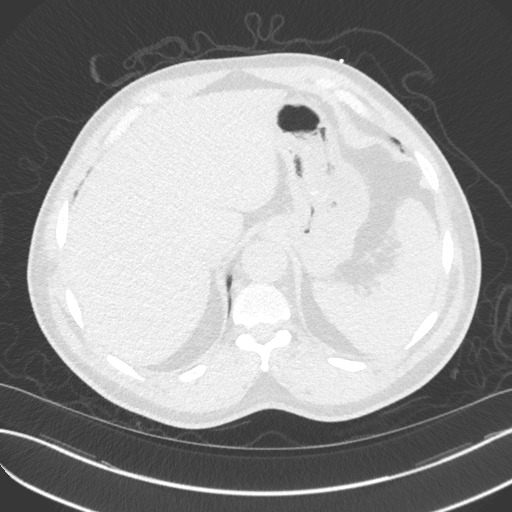
[im 52/175  lung]
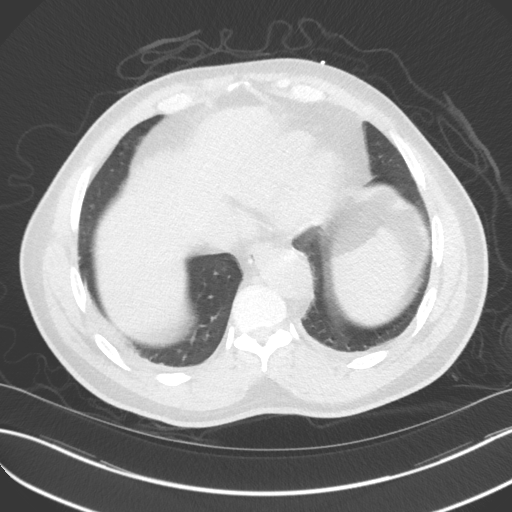
[im 65/175  mediastinal]
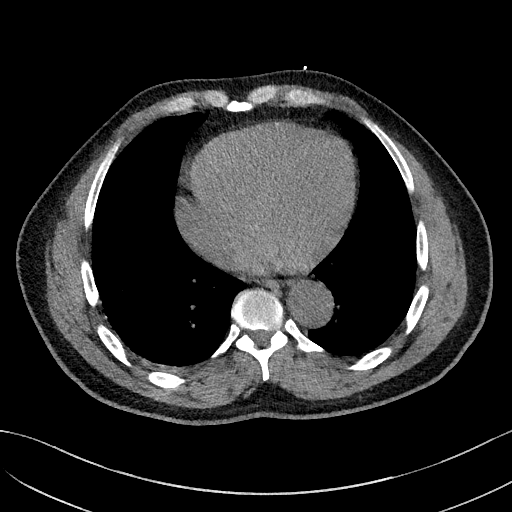
[im 65/175  lung]
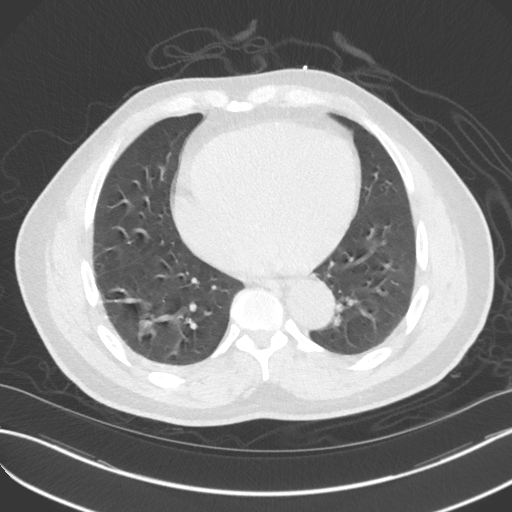
[im 78/175  lung]
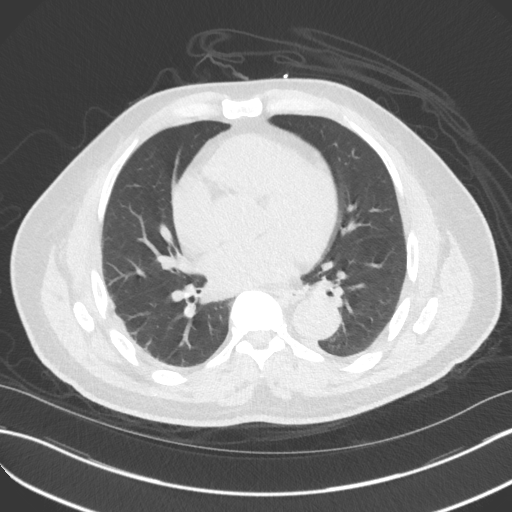
[im 97/175  lung]
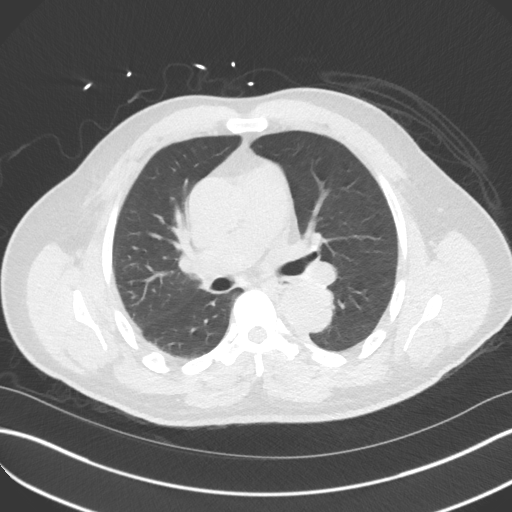
[im 110/175  lung]
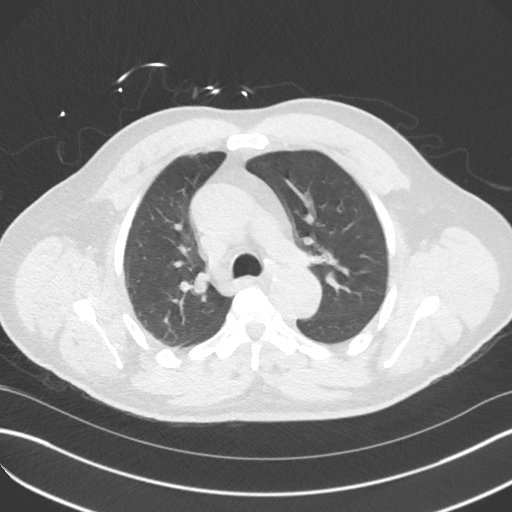
[im 123/175  mediastinal]
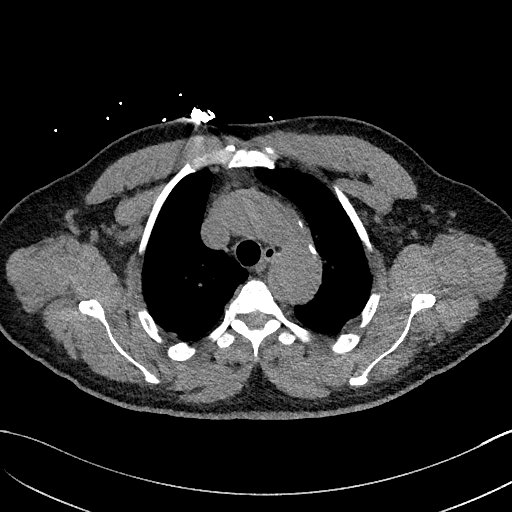
[im 123/175  lung]
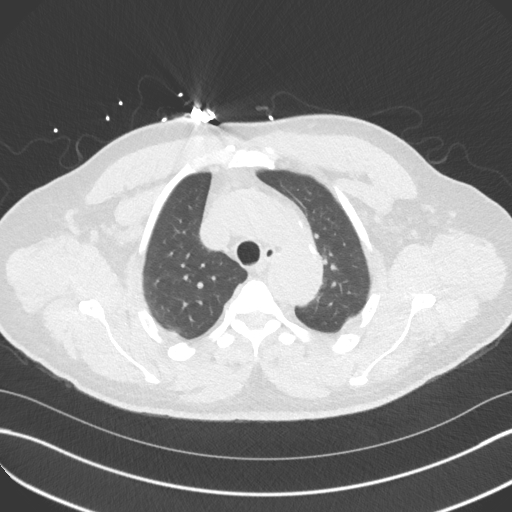
[im 136/175  lung]
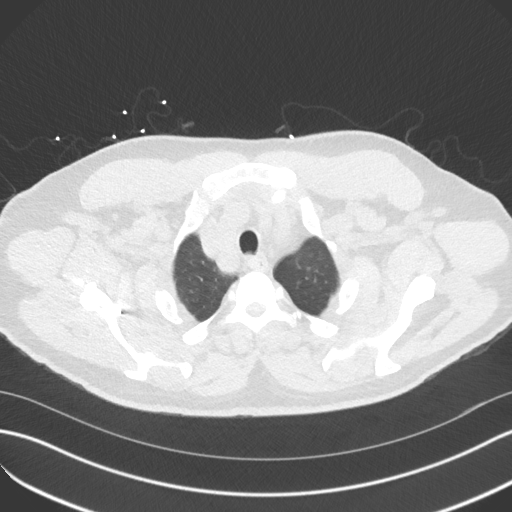
[im 149/175  lung]
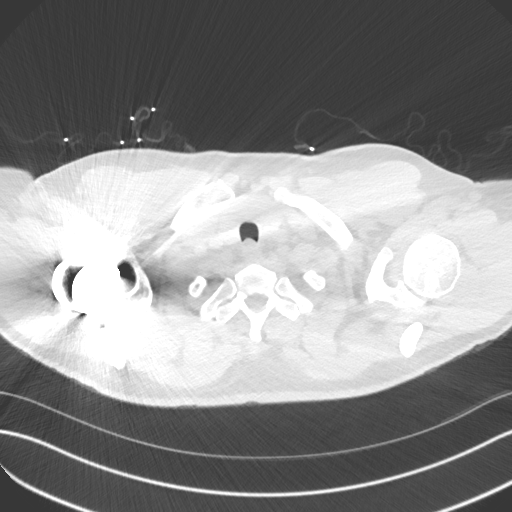
[im 162/175  lung]
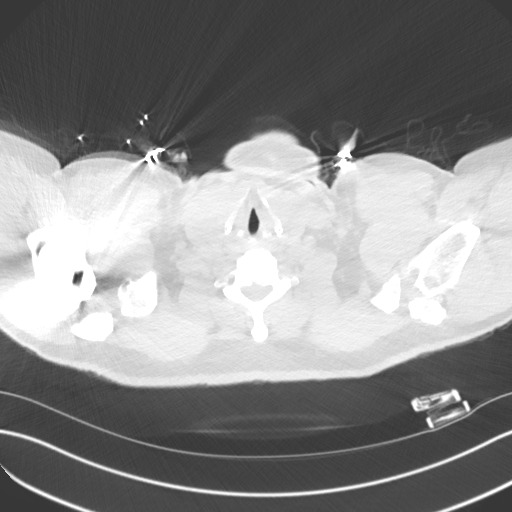

[Series 5: chest w/o 2mm st cor · coronal · non-contrast · 0.68mm/px · 3 of 151 slices shown]
[im 31/151  lung]
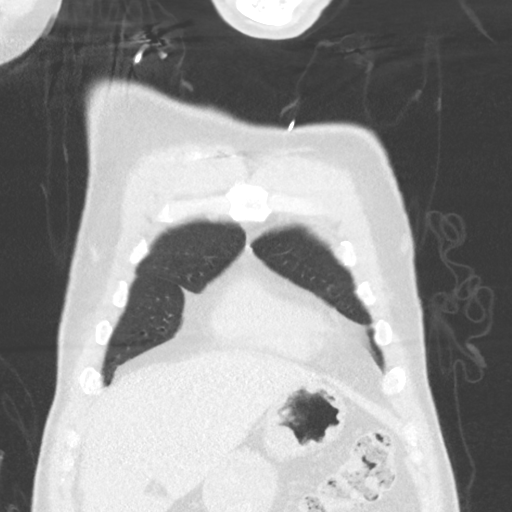
[im 61/151  lung]
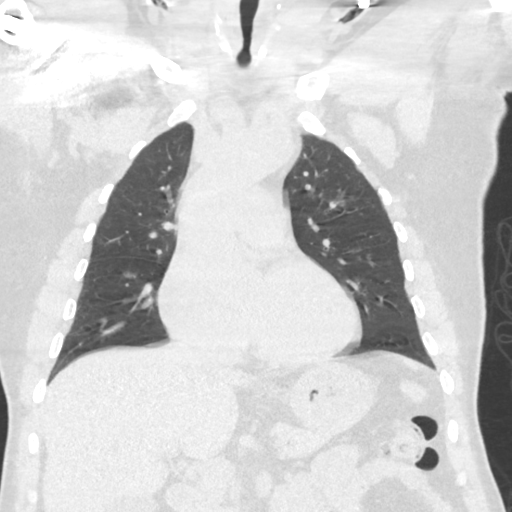
[im 91/151  lung]
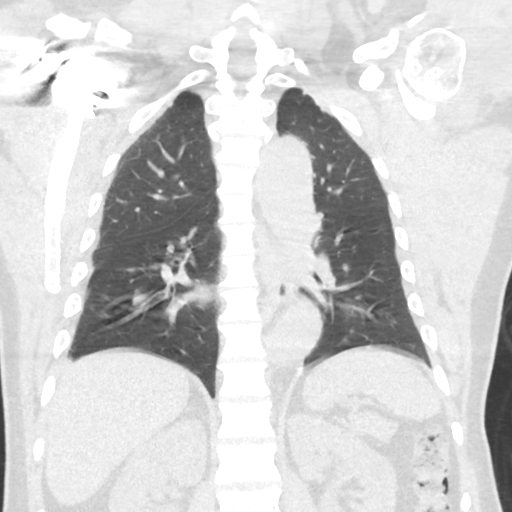

[15 of 36 positions shown; findings below may reference images not displayed]

FINDINGS: Evaluation of this exam is limited in the absence of intravenous
contrast.

Cardiovascular: There is no cardiomegaly or pericardial effusion.
Mild atherosclerotic calcification of the thoracic aorta. No
aneurysmal dilatation. The central pulmonary arteries are grossly
unremarkable.

Mediastinum/Nodes: No hilar or mediastinal adenopathy. Evaluation
however is limited in the absence of intravenous contrast. The
esophagus is grossly unremarkable. There is suboptimal evaluation of
the superior mediastinum due to streak artifact caused by right
shoulder arthroplasty as well as in the absence of intravenous
contrast. There is a 2.2 x 1.4 cm fluid attenuating/cystic structure
anterior to the aorta (46/3) which was present on the prior CT of
4444, likely a cyst. No discrete solid mass identified.

Lungs/Pleura: Minimal pleural thickening versus trace right pleural
effusion. The lungs are clear. No pneumothorax. The central airways
are patent.

Upper Abdomen: Indeterminate 2 cm left renal hypodense lesion,
likely a cyst.

Musculoskeletal: Right shoulder arthroplasty. Degenerative changes
of the left shoulder. No acute osseous pathology.
IMPRESSION: 1. Probable small cyst anterior to the aorta in the upper
mediastinum, stable since 4444. No suspicious mediastinal mass.
2. Minimal right pleural thickening versus less likely trace pleural
effusion.
3. Aortic Atherosclerosis ([id number]).

## 2023-07-21 ENCOUNTER — Other Ambulatory Visit: Payer: Self-pay

## 2023-07-21 ENCOUNTER — Emergency Department (HOSPITAL_COMMUNITY)
Admission: EM | Admit: 2023-07-21 | Discharge: 2023-07-21 | Disposition: A | Discharge status: Home / Self Care | Payer: Self-pay | Attending: Emergency Medicine | Admitting: Emergency Medicine

## 2023-07-21 ENCOUNTER — Encounter (HOSPITAL_COMMUNITY): Payer: Self-pay

## 2023-07-21 ENCOUNTER — Emergency Department (HOSPITAL_COMMUNITY): Payer: Self-pay

## 2023-07-21 DIAGNOSIS — R079 Chest pain, unspecified: Secondary | ICD-10-CM | POA: Insufficient documentation

## 2023-07-21 DIAGNOSIS — R0602 Shortness of breath: Secondary | ICD-10-CM | POA: Insufficient documentation

## 2023-07-21 DIAGNOSIS — R42 Dizziness and giddiness: Secondary | ICD-10-CM | POA: Insufficient documentation

## 2023-07-21 HISTORY — DX: Unspecified convulsions: R56.9

## 2023-07-21 LAB — TROPONIN I (HIGH SENSITIVITY)
Troponin I (High Sensitivity): 4 ng/L (ref <18)
Troponin I (High Sensitivity): 3 ng/L (ref <18)

## 2023-07-21 LAB — CBC
HCT: 41.7 % (ref 39.0–52.0)
Hemoglobin: 13.2 g/dL (ref 13.0–17.0)
MCH: 26.8 pg (ref 26.0–34.0)
MCHC: 31.7 g/dL (ref 30.0–36.0)
MCV: 84.6 fL (ref 80.0–100.0)
Platelets: 238 10*3/uL (ref 150–400)
RBC: 4.93 MIL/uL (ref 4.22–5.81)
RDW: 11.8 % (ref 11.5–15.5)
WBC: 3.8 10*3/uL — ABNORMAL LOW (ref 4.0–10.5)
nRBC: 0 % (ref 0.0–0.2)

## 2023-07-21 LAB — D-DIMER, QUANTITATIVE: D-Dimer, Quant: <0.27 ug{FEU}/mL (ref 0.00–0.50)

## 2023-07-21 LAB — BASIC METABOLIC PANEL
Anion gap: 10 (ref 5–15)
BUN: 11 mg/dL (ref 8–23)
CO2: 24 mmol/L (ref 22–32)
Calcium: 9.2 mg/dL (ref 8.9–10.3)
Chloride: 103 mmol/L (ref 98–111)
Creatinine, Ser: 0.69 mg/dL (ref 0.61–1.24)
GFR, Estimated: >60 mL/min (ref >60)
Glucose, Bld: 127 mg/dL — ABNORMAL HIGH (ref 70–99)
Potassium: 4.2 mmol/L (ref 3.5–5.1)
Sodium: 137 mmol/L (ref 135–145)

## 2023-07-21 NOTE — ED Provider Notes (Signed)
[place] EMERGENCY DEPARTMENT AT [facility] Provider Note   CSN: [id number] Arrival date & time: 07/21/23  1024     History  Chief Complaint  Patient presents with   Dizziness    Joe Bell is a 62 y.o. male with a history of seizures presents to emergency department for evaluation of dizziness that occurs with movement of his head that started 3 days ago.  He also complains of intermittent chest pain and shortness of breath that has been occurring over the past 3 years.  He does not currently have chest pain or shortness of breath.  He denies fevers, cough, nasal congestion, hemoptysis, pedal edema, injury.  The history is provided by the patient and a friend. The history is limited by a language barrier (Translation services were offered however the patient requested that his friend translate for him).  Dizziness Associated symptoms: no chest pain, no diarrhea, no headaches, no nausea, no palpitations, no shortness of breath, no vomiting and no weakness        Home Medications Prior to Admission medications   Medication Sig Start Date End Date Taking? Authorizing Provider  levETIRAcetam (KEPPRA) 500 MG tablet Take 1 tablet (500 mg total) by mouth 2 (two) times daily. 02/09/23 02/04/24  Windell Norfolk, MD      Allergies    Patient has no known allergies.    Review of Systems   Review of Systems  Constitutional:  Negative for chills, fatigue and fever.  Respiratory:  Negative for cough, chest tightness, shortness of breath and wheezing.   Cardiovascular:  Negative for chest pain and palpitations.  Gastrointestinal:  Negative for abdominal pain, constipation, diarrhea, nausea and vomiting.  Neurological:  Positive for dizziness. Negative for seizures, weakness, light-headedness, numbness and headaches.    Physical Exam Updated Vital Signs BP (!) 123/97   Pulse (!) 58   Temp 98 F (36.7 C)   Resp 15   Ht 5\' 9"  (1.753 m)   Wt 78 kg   SpO2 98%   BMI 25.39  kg/m  Physical Exam Vitals and nursing note reviewed.  Constitutional:      General: He is not in acute distress.    Appearance: Normal appearance. He is not ill-appearing.  HENT:     Head: Normocephalic and atraumatic.     Right Ear: Tympanic membrane, ear canal and external ear normal.     Left Ear: Tympanic membrane, ear canal and external ear normal.     Ears:     Comments: No bulging TM nor effusion bilaterally    Nose: Nose normal. No congestion or rhinorrhea.     Mouth/Throat:     Mouth: Mucous membranes are moist.     Pharynx: No oropharyngeal exudate or posterior oropharyngeal erythema.  Eyes:     General:        Right eye: No discharge.        Left eye: No discharge.     Extraocular Movements: Extraocular movements intact.     Conjunctiva/sclera: Conjunctivae normal.     Pupils: Pupils are equal, round, and reactive to light.  Cardiovascular:     Rate and Rhythm: Normal rate.     Pulses: Normal pulses.  Pulmonary:     Effort: Pulmonary effort is normal. No respiratory distress.     Breath sounds: Normal breath sounds. No wheezing.     Comments: Speaks in full and complete sentences without difficulty No tachypnea, retractions, nor increased work of breathing at rest Chest:  Chest wall: No tenderness.  Abdominal:     General: There is no distension.     Palpations: Abdomen is soft.     Tenderness: There is no abdominal tenderness. There is no right CVA tenderness or left CVA tenderness.  Musculoskeletal:     Cervical back: Normal range of motion and neck supple. No rigidity or tenderness.     Right lower leg: No edema.     Left lower leg: No edema.  Lymphadenopathy:     Cervical: No cervical adenopathy.  Skin:    Coloration: Skin is not jaundiced or pale.  Neurological:     General: No focal deficit present.     Mental Status: He is alert and oriented to person, place, and time. Mental status is at baseline.     Cranial Nerves: No cranial nerve deficit.      Sensory: No sensory deficit.     Motor: No weakness.     Coordination: Coordination normal.     Gait: Gait normal.     Deep Tendon Reflexes: Reflexes normal.     Comments: No nystagmus bilaterally Follows commands appropriately Sensation and motor function equal and intact bilaterally Ambulates without difficulty No dizziness with orthostatics Grip strength intact and equal No facial droop     ED Results / Procedures / Treatments   Labs (all labs ordered are listed, but only abnormal results are displayed) Labs Reviewed  BASIC METABOLIC PANEL - Abnormal; Notable for the following components:      Result Value   Glucose, Bld 127 (*)    All other components within normal limits  CBC - Abnormal; Notable for the following components:   WBC 3.8 (*)    All other components within normal limits  D-DIMER, QUANTITATIVE  TROPONIN I (HIGH SENSITIVITY)  TROPONIN I (HIGH SENSITIVITY)    EKG EKG Interpretation Date/Time:  Wednesday July 21 2023 10:34:22 EDT Ventricular Rate:  72 PR Interval:  156 QRS Duration:  90 QT Interval:  396 QTC Calculation: 433 R Axis:   -51  Text Interpretation: Normal sinus rhythm Left anterior fascicular block Minimal voltage criteria for LVH, may be normal variant ( R in aVL ) Nonspecific T wave abnormality Abnormal ECG T wave flattening in inferior leads Confirmed by Ernie Avena ([id number]) on 07/21/2023 12:30:18 PM  Radiology DG Chest 2 View  Result Date: 07/21/2023 CLINICAL DATA:  Chest pain EXAM: CHEST - 2 VIEW COMPARISON:  05/25/2021 FINDINGS: Stable heart size. Unchanged prominence of the pericardial fat on the right. Aortic atherosclerosis. No focal airspace consolidation, pleural effusion, or pneumothorax. Reverse right shoulder arthroplasty. IMPRESSION: No active cardiopulmonary disease. Electronically Signed   By: Duanne Guess D.O.   On: 07/21/2023 13:17    Procedures Procedures    Medications Ordered in ED Medications - No data to  display  ED Course/ Medical Decision Making/ A&P                                 Medical Decision Making Amount and/or Complexity of Data Reviewed Labs: ordered. Radiology: ordered.   Patient presents to the ED for concern of dizziness with changing position, this involves an extensive number of treatment options, and is a complaint that carries with it a high risk of complications and morbidity.    The differential diagnosis for dizziness includes BPPV, dehydration, viral infection. Differential diagnosis for chest pain and shortness of breath over the past 3 years includes  ACS, worsening angina, MSK, anxiety  These are not exhaustive differential diagnoses list  Co morbidities that complicate the patient evaluation  None   Additional history obtained:  Additional history obtained from Nursing, Outside Medical Records, and Past Admission   External records from outside source obtained upon chart review   Lab Tests:  I Ordered, and personally interpreted labs (CBC, CMP, troponin, dimer).  The pertinent results include: No leukocytosis, electrolyte abnormalities, elevated creatinine, abnormalities requiring ED intervention.  Troponin x 2 negative D-dimer negative   Imaging Studies ordered:  I ordered imaging studies including CXR  I independently visualized and interpreted imaging which showed no acute intrathoracic abnormality I agree with the radiologist interpretation   Cardiac Monitoring:  The patient was maintained on a cardiac monitor.  I personally viewed and interpreted the cardiac monitored which showed an underlying rhythm of: NSR with left anterior fascicular block.  There is some flattening of T waves however potassium is WNL   Medicines ordered and prescription drug management:  I ordered medication including meclizine for dizziness I have reviewed the patients home medicines and have made adjustments as needed    Problem List / ED  Course:  Dizziness   Reevaluation:  After the interventions noted above, I reevaluated the patient and found that they have :stayed the same   Dispostion:  Patient is resting comfortably in bed.   He reports that dizziness occurs when he moves to lay on right or left side while supine but is asymptomatic while laying supine or standing that has been occurring over the past three days.  Patient has no orthostatic hypotension nor symptoms of dizziness with changing of positions from supine to sitting to standing. He denies recent head injury, headache, N/V making my suspicion for ICH or concussion low. He reports that he doesn't drink water throughout the day. Based on reassuring PE, no acute lab abnormalities, and intact neuroexam, it is likely symptoms are related to BPPV and/or dehydration.  He reports that he has substernal chest pain and shortness of breath that occurs intermittently over the past 3 years without known trigger.  Upon initial assessment, patient reported active CP and shortness of breath but was speaking in full complete sentences with no retractions.  Lung sounds CTAB.  No pedal edema bilaterally.  EKG significant for NSR with left anterior block.  Troponin x 2 and D-dimer negative.  No leukocytosis, electrolyte abnormality, nor acute cardiopulmonary condition noted on chest x-ray. patient is to follow-up with primary care provider regarding further management of this as it does not appear to be acute.  Will provide [place] and wellness for patient to obtain primary care provider for routine medical maintenance and further workup of these chronic complaints.   Patient has no complaints of respiratory or urinary symptoms. Patient is well-appearing and not tachycardic, tachypneic, hypoxic, febrile in ED. Upon reassessment, patient has no complaints of chest pain or shortness of breath.  After consideration of the diagnostic results and the patients response to treatment, I  feel that the patent would benefit from outpatient PCP follow-up for routine medical management and possible ENT follow-up.  I will prescribe him meclizine for symptomatic treatment as needed for dizziness.  Discussed lab work, imaging, disposition, return to emergency department precautions to patient and patient's friend.  They expressed understanding and agree with plan at this time.  Translator services offered to patient.  He continued to prefer that his friend translate for him.  Friend translated ED workup and disposition.  Final Clinical Impression(s) / ED Diagnoses Final diagnoses:  None    Rx / DC Orders ED Discharge Orders     None         Judithann Sheen, PA 07/21/23 1618    Ernie Avena, MD 07/24/23 1103

## 2023-07-21 NOTE — ED Triage Notes (Signed)
Pt to ED w/interpreter. Pt speaks [place]. Pt has had dizziness when turning and praying, chest pain, and feeling like heart beating fast. Pt states symptoms started 3 days ago. Pt has shortness of breath w/dizziness. Pt denies nausea or vomiting.

## 2023-11-12 ENCOUNTER — Other Ambulatory Visit: Payer: Self-pay | Admitting: Neurology

## 2023-12-27 ENCOUNTER — Telehealth: Payer: Self-pay | Admitting: Neurology

## 2023-12-27 ENCOUNTER — Encounter: Payer: Self-pay | Admitting: Neurology

## 2023-12-27 NOTE — Telephone Encounter (Signed)
 Unable to reach pt over the phone, no VM box. Sent letter in mail informing pt of need to reschedule 02/15/24 appt - MD out

## 2024-02-15 ENCOUNTER — Ambulatory Visit: Payer: Self-pay | Admitting: Neurology

## 2024-02-21 ENCOUNTER — Other Ambulatory Visit: Payer: Self-pay | Admitting: Neurology
